# Patient Record
Sex: Male | Born: 1995 | Race: White | Hispanic: No | Marital: Married | Smoking: Never smoker
Health system: Southern US, Community
[De-identification: ages and names within clinical notes are randomized; demographics above are authoritative.]

## PROBLEM LIST (undated history)

## (undated) DIAGNOSIS — F419 Anxiety disorder, unspecified: Secondary | ICD-10-CM

## (undated) DIAGNOSIS — F909 Attention-deficit hyperactivity disorder, unspecified type: Secondary | ICD-10-CM

## (undated) DIAGNOSIS — N289 Disorder of kidney and ureter, unspecified: Secondary | ICD-10-CM

## (undated) DIAGNOSIS — Z87442 Personal history of urinary calculi: Secondary | ICD-10-CM

## (undated) DIAGNOSIS — R0789 Other chest pain: Secondary | ICD-10-CM

## (undated) HISTORY — PX: TONSILLECTOMY: SUR1361

## (undated) HISTORY — PX: WISDOM TOOTH EXTRACTION: SHX21

## (undated) HISTORY — DX: Attention-deficit hyperactivity disorder, unspecified type: F90.9

---

## 2015-01-14 LAB — BASIC METABOLIC PANEL
BUN: 12 (ref 4–21)
Creatinine: 0.8 (ref 0.6–1.3)
Glucose: 87
Sodium: 139 (ref 137–147)

## 2015-01-14 LAB — HEMOGLOBIN A1C: Hemoglobin A1C: 5.2

## 2015-01-14 LAB — HEPATIC FUNCTION PANEL: Bilirubin, Total: 0.9

## 2015-01-14 LAB — TSH: TSH: 1.68 (ref 0.41–5.90)

## 2016-07-23 ENCOUNTER — Encounter (HOSPITAL_COMMUNITY): Payer: Self-pay | Admitting: *Deleted

## 2016-07-23 ENCOUNTER — Emergency Department (HOSPITAL_COMMUNITY): Payer: 59

## 2016-07-23 ENCOUNTER — Emergency Department (HOSPITAL_COMMUNITY)
Admission: EM | Admit: 2016-07-23 | Discharge: 2016-07-23 | Disposition: A | Discharge status: Home / Self Care | Payer: 59 | Attending: Emergency Medicine | Admitting: Emergency Medicine

## 2016-07-23 DIAGNOSIS — R1032 Left lower quadrant pain: Secondary | ICD-10-CM | POA: Diagnosis present

## 2016-07-23 DIAGNOSIS — N200 Calculus of kidney: Secondary | ICD-10-CM | POA: Diagnosis not present

## 2016-07-23 HISTORY — DX: Other chest pain: R07.89

## 2016-07-23 LAB — CBC WITH DIFFERENTIAL/PLATELET
Basophils Absolute: 0 K/uL (ref 0.0–0.1)
Basophils Relative: 0 %
Eosinophils Absolute: 0.1 K/uL (ref 0.0–0.7)
Eosinophils Relative: 1 %
HCT: 43.9 % (ref 39.0–52.0)
Hemoglobin: 14.7 g/dL (ref 13.0–17.0)
Lymphocytes Relative: 23 %
Lymphs Abs: 3 K/uL (ref 0.7–4.0)
MCH: 29.1 pg (ref 26.0–34.0)
MCHC: 33.5 g/dL (ref 30.0–36.0)
MCV: 86.9 fL (ref 78.0–100.0)
Monocytes Absolute: 0.9 K/uL (ref 0.1–1.0)
Monocytes Relative: 7 %
Neutro Abs: 9.1 K/uL — ABNORMAL HIGH (ref 1.7–7.7)
Neutrophils Relative %: 69 %
Platelets: 213 K/uL (ref 150–400)
RBC: 5.05 MIL/uL (ref 4.22–5.81)
RDW: 12.8 % (ref 11.5–15.5)
WBC: 13.1 K/uL — ABNORMAL HIGH (ref 4.0–10.5)

## 2016-07-23 LAB — URINALYSIS, ROUTINE W REFLEX MICROSCOPIC
BILIRUBIN URINE: NEGATIVE
GLUCOSE, UA: NEGATIVE mg/dL
KETONES UR: NEGATIVE mg/dL
Leukocytes, UA: NEGATIVE
NITRITE: NEGATIVE
PH: 6 (ref 5.0–8.0)
Protein, ur: NEGATIVE mg/dL
Specific Gravity, Urine: 1.03 (ref 1.005–1.030)

## 2016-07-23 LAB — COMPREHENSIVE METABOLIC PANEL WITH GFR
ALT: 22 U/L (ref 17–63)
AST: 23 U/L (ref 15–41)
Albumin: 4.7 g/dL (ref 3.5–5.0)
Alkaline Phosphatase: 80 U/L (ref 38–126)
Anion gap: 9 (ref 5–15)
BUN: 13 mg/dL (ref 6–20)
CO2: 25 mmol/L (ref 22–32)
Calcium: 9.3 mg/dL (ref 8.9–10.3)
Chloride: 103 mmol/L (ref 101–111)
Creatinine, Ser: 1.03 mg/dL (ref 0.61–1.24)
GFR calc Af Amer: >60 mL/min
GFR calc non Af Amer: >60 mL/min
Glucose, Bld: 119 mg/dL — ABNORMAL HIGH (ref 65–99)
Potassium: 3.9 mmol/L (ref 3.5–5.1)
Sodium: 137 mmol/L (ref 135–145)
Total Bilirubin: 0.8 mg/dL (ref 0.3–1.2)
Total Protein: 8.1 g/dL (ref 6.5–8.1)

## 2016-07-23 LAB — URINE MICROSCOPIC-ADD ON

## 2016-07-23 LAB — LIPASE, BLOOD: Lipase: 17 U/L (ref 11–51)

## 2016-07-23 MED ORDER — KETOROLAC TROMETHAMINE 30 MG/ML IJ SOLN
30.0000 mg | Freq: Once | INTRAMUSCULAR | Status: AC
  Administered 2016-07-23: 30 mg via INTRAVENOUS
  Filled 2016-07-23: qty 1

## 2016-07-23 MED ORDER — HYDROCODONE-ACETAMINOPHEN 5-325 MG PO TABS: 1.0000 | ORAL_TABLET | Freq: Four times a day (QID) | ORAL | 0 refills | Status: DC | PRN

## 2016-07-23 MED ORDER — ONDANSETRON HCL 4 MG/2ML IJ SOLN
4.0000 mg | Freq: Once | INTRAMUSCULAR | Status: AC
  Administered 2016-07-23: 4 mg via INTRAVENOUS
  Filled 2016-07-23: qty 2

## 2016-07-23 MED ORDER — ONDANSETRON 4 MG PO TBDP: 4.0000 mg | ORAL_TABLET | Freq: Three times a day (TID) | ORAL | 0 refills | Status: DC | PRN

## 2016-07-23 MED ORDER — TAMSULOSIN HCL 0.4 MG PO CAPS: 0.4000 mg | ORAL_CAPSULE | Freq: Every day | ORAL | 0 refills | Status: DC

## 2016-07-23 MED ORDER — SODIUM CHLORIDE 0.9 % IV BOLUS (SEPSIS)
500.0000 mL | Freq: Once | INTRAVENOUS | Status: AC
  Administered 2016-07-23: 500 mL via INTRAVENOUS

## 2016-07-23 MED ORDER — MORPHINE SULFATE (PF) 4 MG/ML IV SOLN
4.0000 mg | Freq: Once | INTRAVENOUS | Status: AC
  Administered 2016-07-23: 4 mg via INTRAVENOUS
  Filled 2016-07-23: qty 1

## 2016-07-23 NOTE — ED Provider Notes (Signed)
[facility]-EMERGENCY DEPT Provider Note   CSN: [id number] Arrival date & time: 07/23/16  4098     History   Chief Complaint Chief Complaint  Patient presents with  . Flank Pain    HPI Jason Wheeler is a 20 y.o. male with no significant past medical history who presents to the ED today complaining of left-sided flank pain. Patient reports intermittent left-sided flank pain onset 3 days ago. Patient states that he was woken up from sleep on Friday night with sharp pain in his left side and subsequently began vomiting. He states the following day he felt much better so he thought this was likely a GI bug. However, he was again woken from sleep this morning with severe left-sided flank pain that is not radiating into his left groin. Patient also has intractable nausea and vomiting, and dysuria onset today. Emesis is nonbloody and nonbilious. He denies hematuria, fevers, chills. Last BM was yesterday and was normal in color and caliber.  HPI  Past Medical History:  Diagnosis Date  . Chest wall pain     There are no active problems to display for this patient.   Past Surgical History:  Procedure Laterality Date  . TONSILLECTOMY         Home Medications    Prior to Admission medications   Not on File    Family History No family history on file.  Social History Social History  Substance Use Topics  . Smoking status: Never Smoker  . Smokeless tobacco: Never Used  . Alcohol use Yes     Comment: 1 beer once a month     Allergies   Review of patient's allergies indicates no known allergies.   Review of Systems Review of Systems  All other systems reviewed and are negative.    Physical Exam Updated Vital Signs BP 117/78 (BP Location: Right Arm)   Pulse (!) 59   Temp 97.5 F (36.4 C) (Oral)   Resp 16   Ht 5' 6.5" (1.689 m)   Wt 113.4 kg   SpO2 100%   BMI 39.75 kg/m   Physical Exam  Constitutional: He is oriented to person, place, and time. He appears  well-developed and well-nourished. No distress.  Patient standing in room, appears uncomfortable  HENT:  Head: Normocephalic and atraumatic.  Mouth/Throat: No oropharyngeal exudate.  Eyes: Conjunctivae and EOM are normal. Pupils are equal, round, and reactive to light. Right eye exhibits no discharge. Left eye exhibits no discharge. No scleral icterus.  Cardiovascular: Normal rate, regular rhythm, normal heart sounds and intact distal pulses.  Exam reveals no gallop and no friction rub.   No murmur heard. Pulmonary/Chest: Effort normal and breath sounds normal. No respiratory distress. He has no wheezes. He has no rales. He exhibits no tenderness.  Abdominal: Soft. Bowel sounds are normal. He exhibits no distension. There is tenderness ( Left lower quadrant). There is no guarding.  Left-sided CVA tenderness  Musculoskeletal: Normal range of motion. He exhibits no edema.  Neurological: He is alert and oriented to person, place, and time.  Skin: Skin is warm and dry. No rash noted. He is not diaphoretic. No erythema. No pallor.  Psychiatric: He has a normal mood and affect. His behavior is normal.  Nursing note and vitals reviewed.    ED Treatments / Results  Labs (all labs ordered are listed, but only abnormal results are displayed) Labs Reviewed  URINALYSIS, ROUTINE W REFLEX MICROSCOPIC (NOT AT [facility]) - Abnormal; Notable for the following:  Result Value   Hgb urine dipstick LARGE (*)    All other components within normal limits  COMPREHENSIVE METABOLIC PANEL - Abnormal; Notable for the following:    Glucose, Bld 119 (*)    All other components within normal limits  CBC WITH DIFFERENTIAL/PLATELET - Abnormal; Notable for the following:    WBC 13.1 (*)    Neutro Abs 9.1 (*)    All other components within normal limits  URINE MICROSCOPIC-ADD ON - Abnormal; Notable for the following:    Squamous Epithelial / LPF 0-5 (*)    Bacteria, UA FEW (*)    Crystals CA OXALATE CRYSTALS (*)      All other components within normal limits  URINE CULTURE  LIPASE, BLOOD    EKG  EKG Interpretation None       Radiology Ct Renal Stone Study  Result Date: 07/23/2016 CLINICAL DATA:  Left-sided flank pain for several days, initial encounter EXAM: CT ABDOMEN AND PELVIS WITHOUT CONTRAST TECHNIQUE: Multidetector CT imaging of the abdomen and pelvis was performed following the standard protocol without IV contrast. COMPARISON:  None. FINDINGS: Lower chest: No acute abnormality. Hepatobiliary: No focal liver abnormality is seen. No gallstones, gallbladder wall thickening, or biliary dilatation. Pancreas: Unremarkable. No pancreatic ductal dilatation or surrounding inflammatory changes. Spleen: Normal in size without focal abnormality. Adrenals/Urinary Tract: Adrenal glands are unremarkable. Kidneys are normal, without renal calculi or focal mass. Mild left-sided hydronephrosis is noted which extends to the level of the left UVJ. A 3-4 mm stone is noted best seen on the coronal images number 94 series 4. The bladder is decompressed. Stomach/Bowel: Stomach is within normal limits. Appendix appears normal. No evidence of bowel wall thickening, distention, or inflammatory changes. Vascular/Lymphatic: No aortic abnormality is noted. Scattered lymph nodes are noted in the mesenteries extending towards a right lower quadrant. This may represent a degree of mesenteric adenitis. Reproductive: Prostate is unremarkable. Other: No abdominal wall hernia or abnormality. No abdominopelvic ascites. Musculoskeletal: No acute or significant osseous findings. IMPRESSION: 3-4 mm stone in the distal left ureter as described with hydronephrosis. Scattered lymph nodes within the mesenteric stenting towards right lower quadrant. No inflammatory changes are seen. This may represent a degree of mesenteric adenitis. Electronically Signed   By: Alcide Clever M.D.   On: 07/23/2016 10:16    Procedures Procedures (including  critical care time)  Medications Ordered in ED Medications  morphine 4 MG/ML injection 4 mg (4 mg Intravenous Given 07/23/16 1011)  ondansetron (ZOFRAN) injection 4 mg (4 mg Intravenous Given 07/23/16 1007)  sodium chloride 0.9 % bolus 500 mL (0 mLs Intravenous Stopped 07/23/16 1056)  ketorolac (TORADOL) 30 MG/ML injection 30 mg (30 mg Intravenous Given 07/23/16 1056)     Initial Impression / Assessment and Plan / ED Course  I have reviewed the triage vital signs and the nursing notes.  Pertinent labs & imaging results that were available during my care of the patient were reviewed by me and considered in my medical decision making (see chart for details).  Clinical Course    Otherwise healthy 20 well-developed male presents to the ED today complaining of sudden onset left-sided flank pain radiating to left groin associated vomiting and dysuria. Patient appears uncomfortable in the ED but his otherwise nontoxic and nonseptic appearing. Concern for nephrolithiasis. Mild leukocytosis present, W BC 13.1. UA does not appear to be infected. CT reveals 3-4 mm stone in the distal left ureter with mild hydronephrosis. Patient was given a dose of IV morphine  and Zofran with moderate symptomatic improvement. Patient was then given additional dose of Toradol with significant symptomatic improvement. Patient is now resting comfortably. No further episodes of emesis while in the ED. Will DC with Flomax, pain and nausea medication. Follow-up with urology for reevaluation. Return precautions outlined in patient discharge instructions.  Final Clinical Impressions(s) / ED Diagnoses   Final diagnoses:  Kidney stone    New Prescriptions New Prescriptions   No medications on file     Dub MikesSamantha Tripp Dowless, PA-C 07/23/16 1137    Cathren LaineKevin Steinl, MD 07/24/16 1502

## 2016-07-23 NOTE — ED Triage Notes (Signed)
Patient states on Friday night and Saturday morning he had left flank pain.  The pain was so intense that he began breathing faster and then had chest pain which has since resolved.  Patient states flank pain improved Saturday night and Sunday, but returned again this morning and is worse than ever.  Patient denies hematuria, but endorses dysuria this morning.  Patient had one episode of vomiting on Saturday.  Patient denies fever and diarrhea.

## 2016-07-23 NOTE — Discharge Instructions (Signed)
Take pain and nausea medications as needed. Take Flomax daily. Drink plenty of fluids. Follow-up with urology for reevaluation. Use filter when urinating to attempt to collect stone. Return to the ED if you experience severe worsening of your symptoms, fevers, uncontrollable vomiting, increased pain.

## 2016-07-23 NOTE — ED Notes (Signed)
Patient presents to the ED from home where he lives with a roommate with complaints of flank pain since Saturday past.  Patient rates pain 9/10.  Patient endorses one episode of vomiting on Saturday, but denies diarrhea. Patient denies fever.    On exam, patient's lung sounds are CTAB.  Heart sounds, S1/S2. Patient's abdomen is soft and tender to palpation, worse LLQ.  Bowel sounds present, but hypoactive.  Patient is alert and oriented to person, place, time and events.

## 2016-07-23 NOTE — ED Notes (Signed)
Pt vomited all over bed. PA at bedside

## 2016-07-24 LAB — URINE CULTURE: Culture: NO GROWTH

## 2017-03-29 DIAGNOSIS — N201 Calculus of ureter: Secondary | ICD-10-CM | POA: Insufficient documentation

## 2017-08-18 IMAGING — CT CT RENAL STONE PROTOCOL
2 of 3 series · 16 of 46 positions shown, 18 images · non-contrast
Comparison: None.

CLINICAL DATA: Left-sided flank pain for several days, initial
encounter

EXAM:
CT ABDOMEN AND PELVIS WITHOUT CONTRAST
TECHNIQUE: Multidetector CT imaging of the abdomen and pelvis was performed
following the standard protocol without IV contrast.

[Series 3: lung · axial · 0.96mm/px · z∈[-100,-18]mm · 13 of 49 slices shown, 15 images]
[im 4/49  soft-tissue]
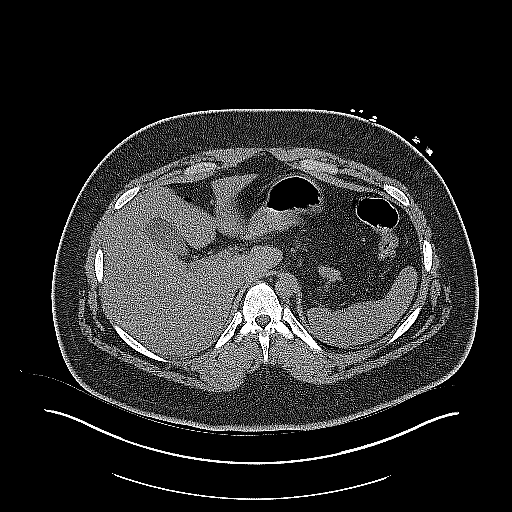
[im 4/49  bone]
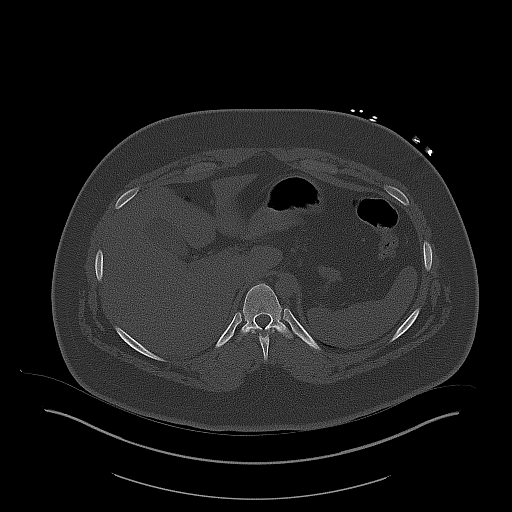
[im 7/49  soft-tissue]
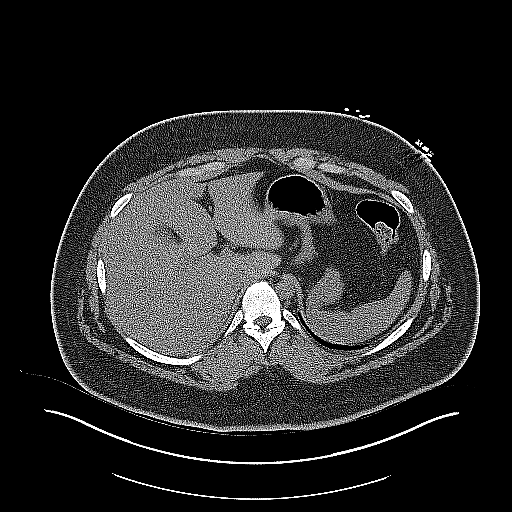
[im 10/49  soft-tissue]
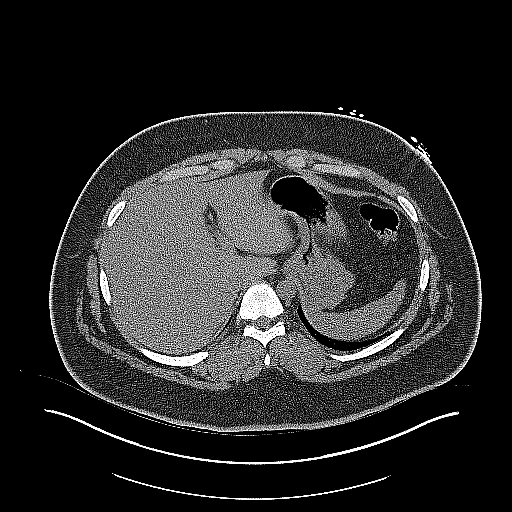
[im 14/49  soft-tissue]
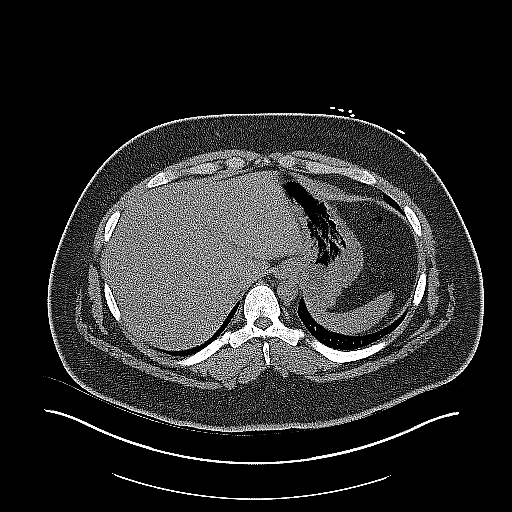
[im 18/49  soft-tissue]
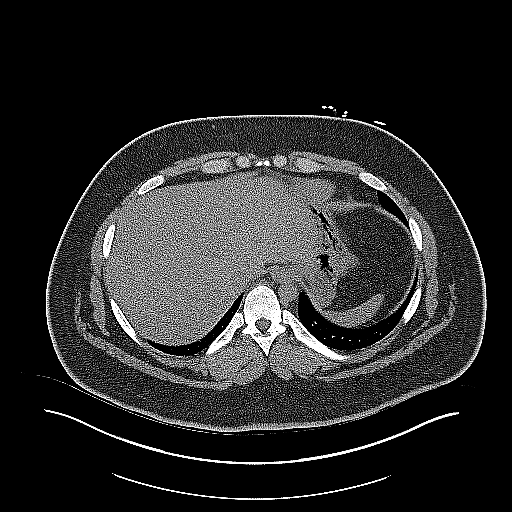
[im 21/49  soft-tissue]
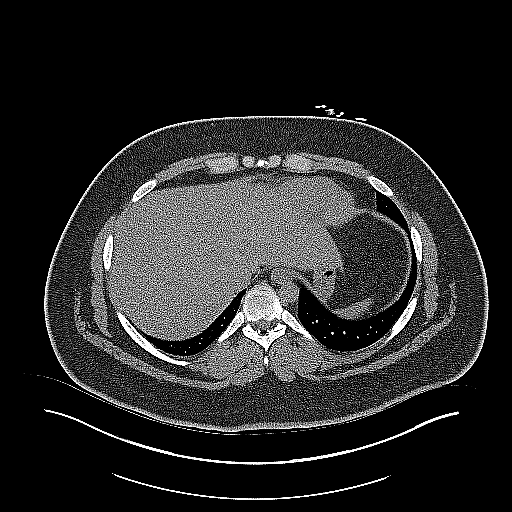
[im 25/49  soft-tissue]
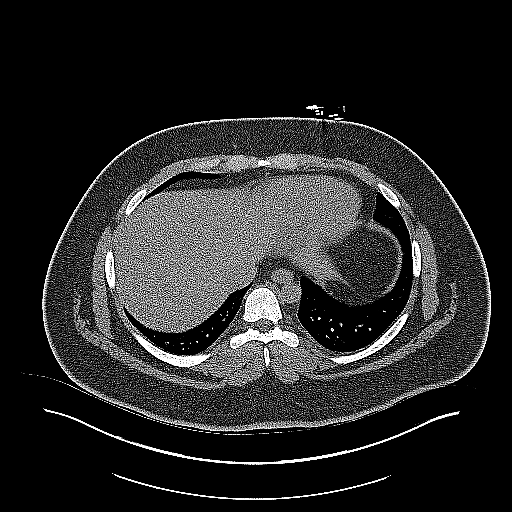
[im 28/49  soft-tissue]
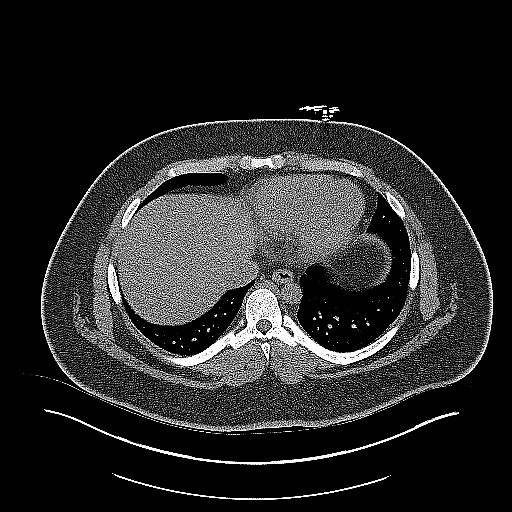
[im 31/49  soft-tissue]
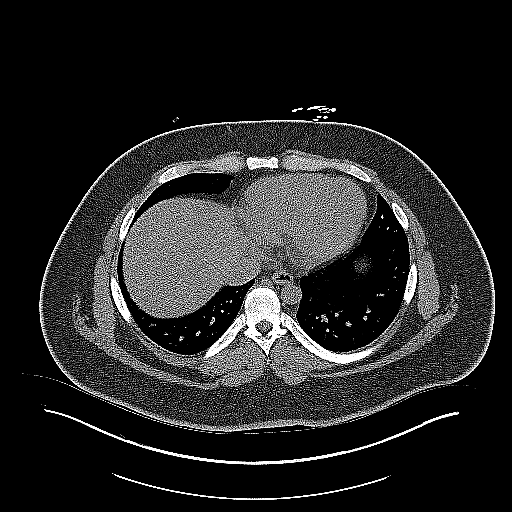
[im 31/49  bone]
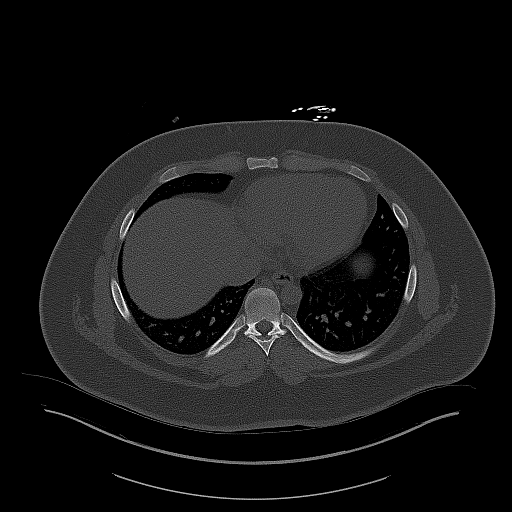
[im 35/49  soft-tissue]
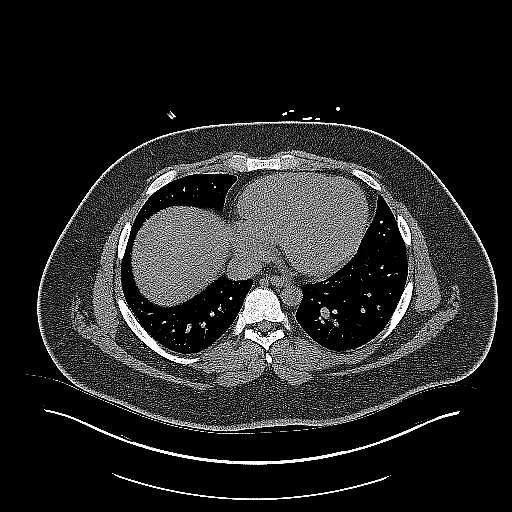
[im 39/49  soft-tissue]
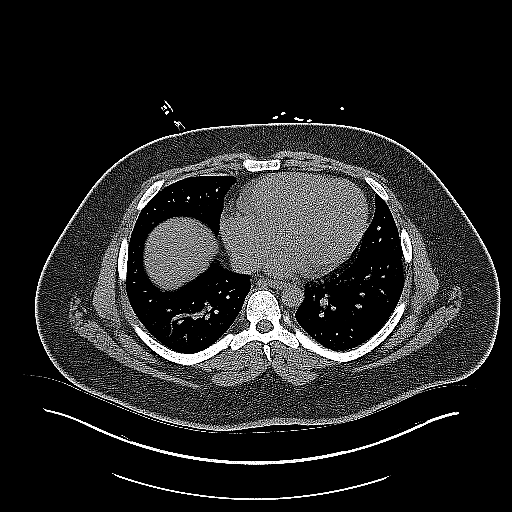
[im 42/49  soft-tissue]
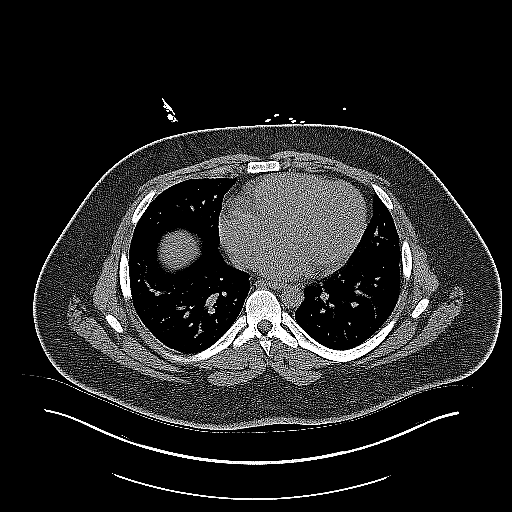
[im 45/49  soft-tissue]
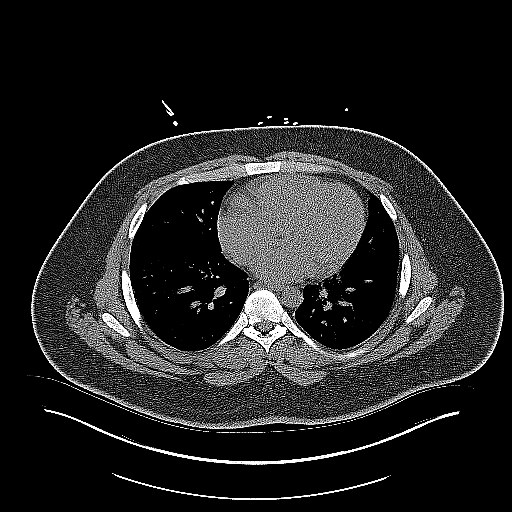

[Series 4: coronal · coronal · 0.83mm/px · 3 of 153 slices shown]
[im 51/153  soft-tissue]
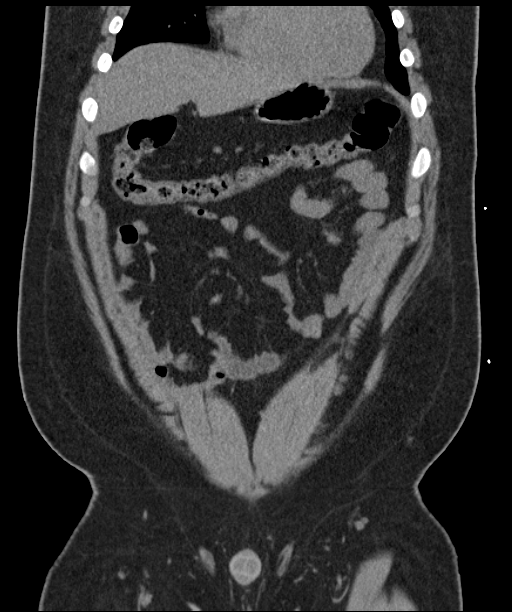
[im 68/153  soft-tissue]
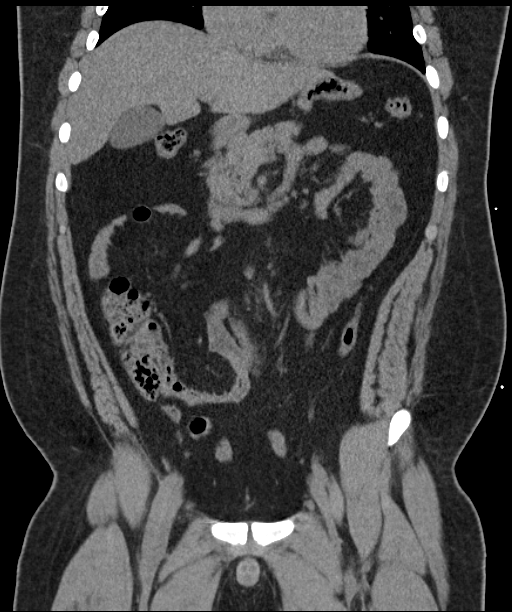
[im 85/153  soft-tissue]
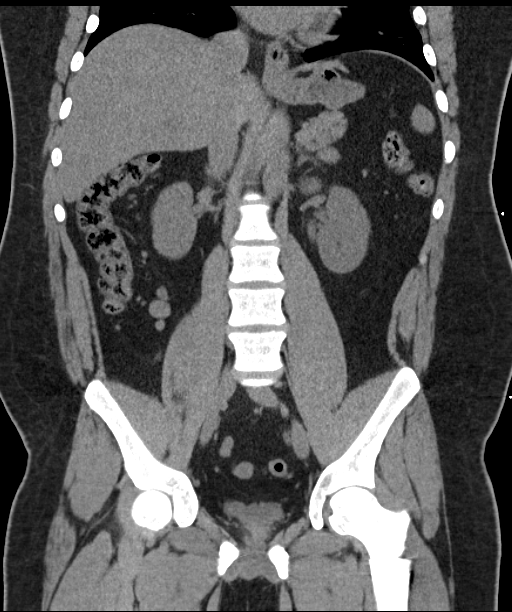

[16 of 46 positions shown; findings below may reference images not displayed]

FINDINGS: Lower chest: No acute abnormality.

Hepatobiliary: No focal liver abnormality is seen. No gallstones,
gallbladder wall thickening, or biliary dilatation.

Pancreas: Unremarkable. No pancreatic ductal dilatation or
surrounding inflammatory changes.

Spleen: Normal in size without focal abnormality.

Adrenals/Urinary Tract: Adrenal glands are unremarkable. Kidneys are
normal, without renal calculi or focal mass. Mild left-sided
hydronephrosis is noted which extends to the level of the left UVJ.
A 3-4 mm stone is noted best seen on the coronal images number 94
series 4. The bladder is decompressed.

Stomach/Bowel: Stomach is within normal limits. Appendix appears
normal. No evidence of bowel wall thickening, distention, or
inflammatory changes.

Vascular/Lymphatic: No aortic abnormality is noted. Scattered lymph
nodes are noted in the mesenteries extending towards a right lower
quadrant. This may represent a degree of mesenteric adenitis.

Reproductive: Prostate is unremarkable.

Other: No abdominal wall hernia or abnormality. No abdominopelvic
ascites.

Musculoskeletal: No acute or significant osseous findings.
IMPRESSION: 3-4 mm stone in the distal left ureter as described with
hydronephrosis.

Scattered lymph nodes within the mesenteric stenting towards right
lower quadrant. No inflammatory changes are seen. This may represent
a degree of mesenteric adenitis.

## 2018-01-21 DIAGNOSIS — N200 Calculus of kidney: Secondary | ICD-10-CM | POA: Diagnosis not present

## 2018-02-20 DIAGNOSIS — F4010 Social phobia, unspecified: Secondary | ICD-10-CM | POA: Diagnosis not present

## 2018-02-20 DIAGNOSIS — F605 Obsessive-compulsive personality disorder: Secondary | ICD-10-CM | POA: Diagnosis not present

## 2018-02-20 DIAGNOSIS — F900 Attention-deficit hyperactivity disorder, predominantly inattentive type: Secondary | ICD-10-CM | POA: Diagnosis not present

## 2018-02-27 DIAGNOSIS — H5211 Myopia, right eye: Secondary | ICD-10-CM | POA: Diagnosis not present

## 2018-02-27 DIAGNOSIS — H52222 Regular astigmatism, left eye: Secondary | ICD-10-CM | POA: Diagnosis not present

## 2018-03-18 DIAGNOSIS — F900 Attention-deficit hyperactivity disorder, predominantly inattentive type: Secondary | ICD-10-CM | POA: Diagnosis not present

## 2018-03-18 DIAGNOSIS — F605 Obsessive-compulsive personality disorder: Secondary | ICD-10-CM | POA: Diagnosis not present

## 2018-03-18 DIAGNOSIS — F4010 Social phobia, unspecified: Secondary | ICD-10-CM | POA: Diagnosis not present

## 2018-04-28 ENCOUNTER — Emergency Department (HOSPITAL_COMMUNITY): Payer: 59

## 2018-04-28 ENCOUNTER — Encounter (HOSPITAL_COMMUNITY): Payer: Self-pay | Admitting: Emergency Medicine

## 2018-04-28 ENCOUNTER — Emergency Department (HOSPITAL_COMMUNITY)
Admission: EM | Admit: 2018-04-28 | Discharge: 2018-04-29 | Disposition: A | Discharge status: Home / Self Care | Payer: 59 | Attending: Emergency Medicine | Admitting: Emergency Medicine

## 2018-04-28 DIAGNOSIS — R55 Syncope and collapse: Secondary | ICD-10-CM | POA: Insufficient documentation

## 2018-04-28 DIAGNOSIS — Z79899 Other long term (current) drug therapy: Secondary | ICD-10-CM | POA: Insufficient documentation

## 2018-04-28 DIAGNOSIS — R200 Anesthesia of skin: Secondary | ICD-10-CM | POA: Diagnosis not present

## 2018-04-28 DIAGNOSIS — S39012A Strain of muscle, fascia and tendon of lower back, initial encounter: Secondary | ICD-10-CM

## 2018-04-28 DIAGNOSIS — G44209 Tension-type headache, unspecified, not intractable: Secondary | ICD-10-CM | POA: Diagnosis not present

## 2018-04-28 DIAGNOSIS — R51 Headache: Secondary | ICD-10-CM | POA: Diagnosis not present

## 2018-04-28 DIAGNOSIS — S29012A Strain of muscle and tendon of back wall of thorax, initial encounter: Secondary | ICD-10-CM | POA: Diagnosis not present

## 2018-04-28 HISTORY — DX: Disorder of kidney and ureter, unspecified: N28.9

## 2018-04-28 LAB — CBC WITH DIFFERENTIAL/PLATELET
Abs Immature Granulocytes: 0 10*3/uL (ref 0.0–0.1)
Basophils Absolute: 0 10*3/uL (ref 0.0–0.1)
Basophils Relative: 0 %
Eosinophils Absolute: 0.1 10*3/uL (ref 0.0–0.7)
Eosinophils Relative: 1 %
HCT: 43.9 % (ref 39.0–52.0)
Hemoglobin: 14.3 g/dL (ref 13.0–17.0)
Immature Granulocytes: 0 %
Lymphocytes Relative: 11 %
Lymphs Abs: 1.4 10*3/uL (ref 0.7–4.0)
MCH: 28.5 pg (ref 26.0–34.0)
MCHC: 32.6 g/dL (ref 30.0–36.0)
MCV: 87.6 fL (ref 78.0–100.0)
Monocytes Absolute: 0.8 10*3/uL (ref 0.1–1.0)
Monocytes Relative: 7 %
Neutro Abs: 10 10*3/uL — ABNORMAL HIGH (ref 1.7–7.7)
Neutrophils Relative %: 81 %
Platelets: 192 10*3/uL (ref 150–400)
RBC: 5.01 MIL/uL (ref 4.22–5.81)
RDW: 12.5 % (ref 11.5–15.5)
WBC: 12.3 10*3/uL — ABNORMAL HIGH (ref 4.0–10.5)

## 2018-04-28 LAB — BASIC METABOLIC PANEL
Anion gap: 8 (ref 5–15)
BUN: 11 mg/dL (ref 6–20)
CO2: 25 mmol/L (ref 22–32)
Calcium: 9.3 mg/dL (ref 8.9–10.3)
Chloride: 105 mmol/L (ref 98–111)
Creatinine, Ser: 0.95 mg/dL (ref 0.61–1.24)
GFR calc Af Amer: >60 mL/min (ref >60)
GFR calc non Af Amer: >60 mL/min (ref >60)
Glucose, Bld: 108 mg/dL — ABNORMAL HIGH (ref 70–99)
Potassium: 3.9 mmol/L (ref 3.5–5.1)
Sodium: 138 mmol/L (ref 135–145)

## 2018-04-28 NOTE — ED Triage Notes (Signed)
Pt presents with lumbar spine pain, headache and numbness; pt states that his back pain started first and while sleeping/lying in bed experienced a headache that woke him up, when he sat up in bed he experienced an all over numbness which has resolved

## 2018-04-28 NOTE — ED Provider Notes (Signed)
Patient placed in Quick Look pathway, seen and evaluated   Chief Complaint: numbness, headache, back pain/numbness  HPI:   Patient presents with sudden onset numbness of the mid thoracic and lumbar region which began at around 1 PM.  Patient works overnights at the neuro ICU, states he came home from work and fell asleep and woke up around 10:30 AM.  Around 1 PM developed numbness of his mid and lower back which does not radiate.  He denies bowel or bladder incontinence, saddle anesthesia.  States he felt uncoordinated while he was packing a bag.  States that he then developed a gradual onset, per aggressively worsening frontal headache which is throbbing in nature.  Denies vision changes, slurred speech, or syncope.  Does note mild low back pain at this time, denies any known trauma or falls or bending/lifting/twisting injuries.  ROS: Positive for numbness, headache, back pain Negative for syncope, vision changes  Physical Exam:   Gen: No distress  Psych: Appropriate mood and affect  Skin: Warm    Focused Exam: Cranial nerves II through XII tested intact.  Fluent speech with no evidence of dysarthria or aphasia, no facial droop.  Sensation intact to soft touch of extremities.  There is diffuse midline thoracic and lumbar spine tenderness at around the levels of T10-S1 with bilateral parathoracic and paralumbar muscle tenderness.  No deformity, crepitus, or step-off noted.  5/5 strength of BUE and BLE major muscle groups.  Normal range of motion of the lumbar spine with no significant pain elicited.  No nuchal rigidity, normal active and passive range of motion of the cervical spine.   Initiation of care has begun. The patient has been counseled on the process, plan, and necessity for staying for the completion/evaluation, and the remainder of the medical screening examination    Bennye AlmFawze, Raha Tennison A, PA-C 04/28/18 1817    Nira Connardama, Pedro Eduardo, MD 04/29/18 [id number]

## 2018-04-29 DIAGNOSIS — R51 Headache: Secondary | ICD-10-CM | POA: Diagnosis not present

## 2018-04-29 DIAGNOSIS — R55 Syncope and collapse: Secondary | ICD-10-CM | POA: Diagnosis not present

## 2018-04-29 DIAGNOSIS — Z79899 Other long term (current) drug therapy: Secondary | ICD-10-CM | POA: Diagnosis not present

## 2018-04-29 MED ORDER — KETOROLAC TROMETHAMINE 60 MG/2ML IM SOLN
30.0000 mg | Freq: Once | INTRAMUSCULAR | Status: AC
  Administered 2018-04-29: 30 mg via INTRAMUSCULAR
  Filled 2018-04-29: qty 2

## 2018-04-29 NOTE — ED Provider Notes (Signed)
[facility] EMERGENCY DEPARTMENT Provider Note  CSN: [id number] Arrival date & time: 04/28/18 1705  Chief Complaint(s) Back Pain; Headache; and Numbness  HPI Jason Wheeler is a 22 y.o. male with no significant past medical history presents to the emergency department for episode of generalized "numbness" of the entire body that occurred at 1 PM today.  Patient reports that he was sitting in bed watching TV and had just finished eating when the symptoms began.  He reports that he felt mid thoracic and lumbar back pain during this episode.  States that  after this episode he woke up and was lying flat on the bed.  He is unsure whether he had a syncopal episode or not.  States that his back pain is exacerbated with twisting and bending.  No alleviating factors.  Has not taken any medication for this.  He reports that several hours after the incident he developed a gradual onset and progressively worsening frontal headache that is throbbing in nature.  Headache has since improved over the last several hours.  Denies any visual changes, difficulty speaking, focal deficits.  He denied any lower extremity weakness or loss of sensation.  No bladder or bowel incontinence.  No saddle anesthesia.  He denies any recent fevers or infections.  No nausea or vomiting.  No urinary symptoms.  No chest pain or shortness of breath.  Patient denied any hematochezia or melena.  Denied any recent trauma.  The history is provided by the patient.    Past Medical History Past Medical History:  Diagnosis Date  . Chest wall pain   . Renal disorder    kidney stones   There are no active problems to display for this patient.  Home Medication(s) Prior to Admission medications   Medication Sig Start Date End Date Taking? Authorizing Provider  HYDROcodone-acetaminophen (NORCO) 5-325 MG tablet Take 1 tablet by mouth every 6 (six) hours as needed for moderate pain. 07/23/16   Dowless, Samantha Tripp, PA-C   ondansetron (ZOFRAN ODT) 4 MG disintegrating tablet Take 1 tablet (4 mg total) by mouth every 8 (eight) hours as needed for nausea or vomiting. 07/23/16   Dowless, Lelon Mast Tripp, PA-C  tamsulosin (FLOMAX) 0.4 MG CAPS capsule Take 1 capsule (0.4 mg total) by mouth daily. 07/23/16   Dowless, Lester Kinsman, PA-C                                                                                                                                    Past Surgical History Past Surgical History:  Procedure Laterality Date  . TONSILLECTOMY     Family History No family history on file.  Social History Social History   Tobacco Use  . Smoking status: Never Smoker  . Smokeless tobacco: Never Used  Substance Use Topics  . Alcohol use: Yes    Comment: 1 beer once a month  . Drug use: Not on file  Allergies Patient has no known allergies.  Review of Systems Review of Systems All other systems are reviewed and are negative for acute change except as noted in the HPI  Physical Exam Vital Signs  I have reviewed the triage vital signs BP 111/80 (BP Location: Left Arm)   Pulse 71   Temp 97.9 F (36.6 C) (Oral)   Resp 16   Ht 5\' 7"  (1.702 m)   Wt 113.4 kg (250 lb)   SpO2 98%   BMI 39.16 kg/m   Physical Exam  Constitutional: He is oriented to person, place, and time. He appears well-developed and well-nourished. No distress.  HENT:  Head: Normocephalic and atraumatic.  Nose: Nose normal.  Eyes: Pupils are equal, round, and reactive to light. Conjunctivae and EOM are normal. Right eye exhibits no discharge. Left eye exhibits no discharge. No scleral icterus.  Neck: Normal range of motion. Neck supple.  Cardiovascular: Normal rate and regular rhythm. Exam reveals no gallop and no friction rub.  No murmur heard. Pulmonary/Chest: Effort normal and breath sounds normal. No stridor. No respiratory distress. He has no rales.  Abdominal: Soft. He exhibits no distension. There is no tenderness.    Musculoskeletal: He exhibits no edema.       Thoracic back: He exhibits tenderness and spasm. He exhibits no bony tenderness.       Back:  Neurological: He is alert and oriented to person, place, and time.  Mental Status:  Alert and oriented to person, place, and time.  Attention and concentration normal.  Speech clear.  Recent memory is intact  Cranial Nerves:  II Visual Fields: Intact to confrontation. Visual fields intact. III, IV, VI: Pupils equal and reactive to light and near. Full eye movement without nystagmus  V Facial Sensation: Normal. No weakness of masticatory muscles  VII: No facial weakness or asymmetry  VIII Auditory Acuity: Grossly normal  IX/X: The uvula is midline; the palate elevates symmetrically  XI: Normal sternocleidomastoid and trapezius strength  XII: The tongue is midline. No atrophy or fasciculations.   Motor System: Muscle Strength: 5/5 and symmetric in the upper and lower extremities. No pronation or drift.  Muscle Tone: Tone and muscle bulk are normal in the upper and lower extremities.   Reflexes: DTRs: 1+ and symmetrical in all four extremities. No Clonus Coordination: Intact finger-to-nose, heel-to-shin. No tremor.  Sensation: Intact to light touch, and pinprick. Negative Romberg test.  Gait: Routine gait normal.   Skin: Skin is warm and dry. No rash noted. He is not diaphoretic. No erythema.  Psychiatric: He has a normal mood and affect.  Vitals reviewed.   ED Results and Treatments Labs (all labs ordered are listed, but only abnormal results are displayed) Labs Reviewed  BASIC METABOLIC PANEL - Abnormal; Notable for the following components:      Result Value   Glucose, Bld 108 (*)    All other components within normal limits  CBC WITH DIFFERENTIAL/PLATELET - Abnormal; Notable for the following components:   WBC 12.3 (*)    Neutro Abs 10.0 (*)    All other components within normal limits  EKG  EKG Interpretation  Date/Time:  Tuesday April 29 2018 00:17:31 EDT Ventricular Rate:  70 PR Interval:  166 QRS Duration: 84 QT Interval:  352 QTC Calculation: 380 R Axis:   15 Text Interpretation:  Sinus rhythm with marked sinus arrhythmia Otherwise normal ECG not Confirmed by Drema Pryardama, Mohid Furuya [phone]([id number]) on 04/29/2018 12:23:39 AM      Radiology Ct Head Wo Contrast  Result Date: 04/28/2018 CLINICAL DATA:  Altered level of consciousness, frontal headache, low back pain EXAM: CT HEAD WITHOUT CONTRAST TECHNIQUE: Contiguous axial images were obtained from the base of the skull through the vertex without intravenous contrast. Sagittal and coronal MPR images reconstructed from axial data set. COMPARISON:  None FINDINGS: Brain: Normal ventricular morphology. No midline shift or mass effect. Normal appearance of brain parenchyma. No intracranial hemorrhage, mass lesion or evidence of acute infarction. No extra-axial fluid collections. Vascular: No hyperdense vessels Skull: Intact Sinuses/Orbits: Clear Other: N/A IMPRESSION: Normal exam. Electronically Signed   By: Ulyses SouthwardMark  Boles M.D.   On: 04/28/2018 21:09   Pertinent labs & imaging results that were available during my care of the patient were reviewed by me and considered in my medical decision making (see chart for details).  Medications Ordered in ED Medications  ketorolac (TORADOL) injection 30 mg (30 mg Intramuscular Given 04/29/18 0019)                                                                                                                                    Procedures Procedures  (including critical care time)  Medical Decision Making / ED Course I have reviewed the nursing notes for this encounter and the patient's prior records (if available in EHR or on provided paperwork).     1. Possible Syncope EKG without acute ischemic changes or evidence of pericarditis.   Revealed normal sinus rhythm with a rate of 70 with sinus dysrhythmia.  No evidence of Brugada, epsilon waves, prolonged QT, or HOCM. Patient seen in first look process and screening labs obtain revealed stable leukocytosis when compared to prior.  No anemia.  No significant electrolyte derangements or renal insufficiency.  2. Headache Non focal neuro exam. No recent head trauma. No fever. Doubt meningitis. Doubt intracranial bleed. Doubt IIH.    Patient already received a CT scan ordered in the first look process which was unremarkable.  3. Back pain Back pain in thoracic and lumbar area for 10 hrs without signs of radicular pain. No acute traumatic onset. No red flag symptoms of fever, weight loss, saddle anesthesia, weakness, fecal/urinary incontinence or urinary retention.   Suspect MSK etiology. No indication for imaging emergently. Patient was recommended to take short course of scheduled NSAIDs and engage in early mobility as definitive treatment. Return precautions discussed for worsening or new concerning symptoms.   The patient appears reasonably screened and/or stabilized for discharge and I doubt any other medical condition or other [facility] requiring further screening,  evaluation, or treatment in the ED at this time prior to discharge.  The patient is safe for discharge with strict return precautions.  Final Clinical Impression(s) / ED Diagnoses Final diagnoses:  Syncope, unspecified syncope type  Back strain, initial encounter  Acute non intractable tension-type headache    Disposition: Discharge  Condition: Good  I have discussed the results, Dx and Tx plan with the patient who expressed understanding and agree(s) with the plan. Discharge instructions discussed at great length. The patient was given strict return precautions who verbalized understanding of the instructions. No further questions at time of discharge.    ED Discharge Orders    None       Follow  Up: Primary care provider  Schedule an appointment as soon as possible for a visit  As needed     This chart was dictated using voice recognition software.  Despite best efforts to proofread,  errors can occur which can change the documentation meaning.   Nira Conn, MD 04/29/18 [place] Manners

## 2018-04-29 NOTE — Discharge Instructions (Addendum)
You may use over-the-counter Motrin (Ibuprofen), Acetaminophen (Tylenol), topical muscle creams such as SalonPas, Icy Hot, Bengay, etc. Please stretch, apply heat, and have massage therapy for additional assistance. ° °

## 2018-12-22 DIAGNOSIS — F4010 Social phobia, unspecified: Secondary | ICD-10-CM | POA: Diagnosis not present

## 2018-12-22 DIAGNOSIS — F605 Obsessive-compulsive personality disorder: Secondary | ICD-10-CM | POA: Diagnosis not present

## 2018-12-22 DIAGNOSIS — F900 Attention-deficit hyperactivity disorder, predominantly inattentive type: Secondary | ICD-10-CM | POA: Diagnosis not present

## 2019-01-14 ENCOUNTER — Other Ambulatory Visit: Payer: Self-pay

## 2019-01-14 ENCOUNTER — Ambulatory Visit (INDEPENDENT_AMBULATORY_CARE_PROVIDER_SITE_OTHER): Payer: 59 | Admitting: Family Medicine

## 2019-01-14 ENCOUNTER — Encounter: Payer: Self-pay | Admitting: Family Medicine

## 2019-01-14 VITALS — BP 124/78 | HR 74 | Temp 97.9°F | Ht 67.0 in | Wt 253.8 lb

## 2019-01-14 DIAGNOSIS — Z13220 Encounter for screening for lipoid disorders: Secondary | ICD-10-CM | POA: Diagnosis not present

## 2019-01-14 DIAGNOSIS — F909 Attention-deficit hyperactivity disorder, unspecified type: Secondary | ICD-10-CM | POA: Insufficient documentation

## 2019-01-14 DIAGNOSIS — Z6839 Body mass index (BMI) 39.0-39.9, adult: Secondary | ICD-10-CM | POA: Diagnosis not present

## 2019-01-14 DIAGNOSIS — Z87442 Personal history of urinary calculi: Secondary | ICD-10-CM

## 2019-01-14 DIAGNOSIS — Z131 Encounter for screening for diabetes mellitus: Secondary | ICD-10-CM | POA: Diagnosis not present

## 2019-01-14 DIAGNOSIS — Z0001 Encounter for general adult medical examination with abnormal findings: Secondary | ICD-10-CM | POA: Diagnosis not present

## 2019-01-14 LAB — COMPREHENSIVE METABOLIC PANEL
ALBUMIN: 4.6 g/dL (ref 3.5–5.2)
ALK PHOS: 73 U/L (ref 39–117)
ALT: 21 U/L (ref 0–53)
AST: 17 U/L (ref 0–37)
BUN: 14 mg/dL (ref 6–23)
CHLORIDE: 104 meq/L (ref 96–112)
CO2: 27 mEq/L (ref 19–32)
Calcium: 9.8 mg/dL (ref 8.4–10.5)
Creatinine, Ser: 0.84 mg/dL (ref 0.40–1.50)
GFR: 113.67 mL/min (ref >60.00)
GLUCOSE: 89 mg/dL (ref 70–99)
POTASSIUM: 4.3 meq/L (ref 3.5–5.1)
SODIUM: 140 meq/L (ref 135–145)
TOTAL PROTEIN: 7.3 g/dL (ref 6.0–8.3)
Total Bilirubin: 0.6 mg/dL (ref 0.2–1.2)

## 2019-01-14 LAB — LIPID PANEL
Cholesterol: 221 mg/dL — ABNORMAL HIGH (ref 0–200)
HDL: 30.5 mg/dL — AB (ref >39.00)
LDL CALC: 167 mg/dL — AB (ref 0–99)
NonHDL: 190
Total CHOL/HDL Ratio: 7
Triglycerides: 116 mg/dL (ref 0.0–149.0)
VLDL: 23.2 mg/dL (ref 0.0–40.0)

## 2019-01-14 LAB — CBC
HEMATOCRIT: 43.4 % (ref 39.0–52.0)
HEMOGLOBIN: 15 g/dL (ref 13.0–17.0)
MCHC: 34.5 g/dL (ref 30.0–36.0)
MCV: 85.1 fl (ref 78.0–100.0)
Platelets: 197 10*3/uL (ref 150.0–400.0)
RBC: 5.1 Mil/uL (ref 4.22–5.81)
RDW: 13 % (ref 11.5–15.5)
WBC: 9.9 10*3/uL (ref 4.0–10.5)

## 2019-01-14 NOTE — Progress Notes (Signed)
Chief Complaint:  Jason Wheeler is a 23 y.o. male who presents today for his annual comprehensive physical exam.    Assessment/Plan:  ADHD Continue management per psychiatry.   History of Nephrolithiasis Check CBC and CMET.   BMI 39 Discussed lifestyle modifications.  Preventative Healthcare: Check lipid panel. Screen for DM on CMET.   Patient Counseling(The following topics were reviewed and/or handout was given):  -Nutrition: Stressed importance of moderation in sodium/caffeine intake, saturated fat and cholesterol, caloric balance, sufficient intake of fresh fruits, vegetables, and fiber.  -Stressed the importance of regular exercise.   -Substance Abuse: Discussed cessation/primary prevention of tobacco, alcohol, or other drug use; driving or other dangerous activities under the influence; availability of treatment for abuse.   -Injury prevention: Discussed safety belts, safety helmets, smoke detector, smoking near bedding or upholstery.   -Sexuality: Discussed sexually transmitted diseases, partner selection, use of condoms, avoidance of unintended pregnancy and contraceptive alternatives.   -Dental health: Discussed importance of regular tooth brushing, flossing, and dental visits.  -Health maintenance and immunizations reviewed. Please refer to Health maintenance section.  Return to care in 1 year for next preventative visit.     Subjective:  HPI:  He has no acute complaints today.   His  chronic medical conditions are outlined below:  # ADHD - Follows with psychiatry - Currently on concerta 36mg  daily  Lifestyle Diet: No specific diets or eating plans.  Exercise: Does not exercise regularly.  Depression screen PHQ 2/9 01/14/2019  Decreased Interest 0  Down, Depressed, Hopeless 0  PHQ - 2 Score 0   Health Maintenance Due  Topic Date Due  . HIV Screening  06/21/2011    ROS: Per HPI, otherwise a complete review of systems was negative.   PMH:  The  following were reviewed and entered/updated in epic: Past Medical History:  Diagnosis Date  . Chest wall pain   . Renal disorder    kidney stones   Patient Active Problem List   Diagnosis Date Noted  . ADHD 01/14/2019   Past Surgical History:  Procedure Laterality Date  . TONSILLECTOMY      Family History  Problem Relation Age of Onset  . Diabetes Mother   . Hypertension Mother   . Miscarriages / [place] Mother   . Hypertension Father   . Throat cancer Maternal Grandfather   . Heart disease Paternal Grandmother   . Stroke Paternal Grandmother   . Prostate cancer Neg Hx   . Colon cancer Neg Hx     Medications- reviewed and updated Current Outpatient Medications  Medication Sig Dispense Refill  . amphetamine-dextroamphetamine (ADDERALL XR) 10 MG 24 hr capsule Take 10 mg by mouth daily.    . methylphenidate (RITALIN) 5 MG tablet Take 5-10 mg by mouth 2 (two) times daily.    . methylphenidate 36 MG PO CR tablet Take 36 mg by mouth daily.     No current facility-administered medications for this visit.     Allergies-reviewed and updated No Known Allergies  Social History   Socioeconomic History  . Marital status: Single    Spouse name: Not on file  . Number of children: Not on file  . Years of education: Not on file  . Highest education level: Not on file  Occupational History  . Not on file  Social Needs  . Financial resource strain: Not on file  . Food insecurity:    Worry: Not on file    Inability: Not on file  . Transportation  needs:    Medical: Not on file    Non-medical: Not on file  Tobacco Use  . Smoking status: Never Smoker  . Smokeless tobacco: Never Used  Substance and Sexual Activity  . Alcohol use: Yes    Comment: 1 beer once a month  . Drug use: Never  . Sexual activity: Not on file  Lifestyle  . Physical activity:    Days per week: Not on file    Minutes per session: Not on file  . Stress: Not on file  Relationships  . Social  connections:    Talks on phone: Not on file    Gets together: Not on file    Attends religious service: Not on file    Active member of club or organization: Not on file    Attends meetings of clubs or organizations: Not on file    Relationship status: Not on file  Other Topics Concern  . Not on file  Social History Narrative  . Not on file        Objective:  Physical Exam: BP 124/78 (BP Location: Left Arm, Patient Position: Sitting, Cuff Size: Normal)   Pulse 74   Temp 97.9 F (36.6 C) (Oral)   Ht 5\' 7"  (1.702 m)   Wt 253 lb 12.8 oz (115.1 kg)   SpO2 96%   BMI 39.75 kg/m   Body mass index is 39.75 kg/m. Wt Readings from Last 3 Encounters:  01/14/19 253 lb 12.8 oz (115.1 kg)  04/28/18 250 lb (113.4 kg)  07/23/16 250 lb (113.4 kg)   Gen: NAD, resting comfortably HEENT: TMs normal bilaterally. OP clear. No thyromegaly noted.  CV: RRR with no murmurs appreciated Pulm: NWOB, CTAB with no crackles, wheezes, or rhonchi GI: Normal bowel sounds present. Soft, Nontender, Nondistended. MSK: no edema, cyanosis, or clubbing noted Skin: warm, dry Neuro: CN2-12 grossly intact. Strength 5/5 in upper and lower extremities. Reflexes symmetric and intact bilaterally.  Psych: Normal affect and thought content     Caleb M. Jimmey Ralph, MD 01/14/2019 9:10 AM

## 2019-01-14 NOTE — Assessment & Plan Note (Signed)
Continue management per psychiatry. 

## 2019-01-14 NOTE — Patient Instructions (Signed)
It was very nice to see you today!   Eat at least 3 REAL meals and 1-2 snacks per day.  Aim for no more than 5 hours between eating.  Eat breakfast within one hour of getting up.    Obtain twice as many fruits/vegetables as protein or carbohydrate foods for both lunch and dinner.   Cut down on sweet beverages. This includes juice, soda, and sweet tea.    Exercise at least 150 minutes every week.   Please let me know once you are on a stable dose of your ADHD medications.   Come back in 1 year for your next physical or sooner as needed.   Take care, Dr Jerline Pain   Preventive Care 18-39 Years, Male Preventive care refers to lifestyle choices and visits with your health care provider that can promote health and wellness. What does preventive care include?   A yearly physical exam. This is also called an annual well check.  Dental exams once or twice a year.  Routine eye exams. Ask your health care provider how often you should have your eyes checked.  Personal lifestyle choices, including: ? Daily care of your teeth and gums. ? Regular physical activity. ? Eating a healthy diet. ? Avoiding tobacco and drug use. ? Limiting alcohol use. ? Practicing safe sex. What happens during an annual well check? The services and screenings done by your health care provider during your annual well check will depend on your age, overall health, lifestyle risk factors, and family history of disease. Counseling Your health care provider may ask you questions about your:  Alcohol use.  Tobacco use.  Drug use.  Emotional well-being.  Home and relationship well-being.  Sexual activity.  Eating habits.  Work and work Statistician. Screening You may have the following tests or measurements:  Height, weight, and BMI.  Blood pressure.  Lipid and cholesterol levels. These may be checked every 5 years starting at age 3.  Diabetes screening. This is done by checking your blood  sugar (glucose) after you have not eaten for a while (fasting).  Skin check.  Hepatitis C blood test.  Hepatitis B blood test.  Sexually transmitted disease (STD) testing. Discuss your test results, treatment options, and if necessary, the need for more tests with your health care provider. Vaccines Your health care provider may recommend certain vaccines, such as:  Influenza vaccine. This is recommended every year.  Tetanus, diphtheria, and acellular pertussis (Tdap, Td) vaccine. You may need a Td booster every 10 years.  Varicella vaccine. You may need this if you have not been vaccinated.  HPV vaccine. If you are 60 or younger, you may need three doses over 6 months.  Measles, mumps, and rubella (MMR) vaccine. You may need at least one dose of MMR.You may also need a second dose.  Pneumococcal 13-valent conjugate (PCV13) vaccine. You may need this if you have certain conditions and have not been vaccinated.  Pneumococcal polysaccharide (PPSV23) vaccine. You may need one or two doses if you smoke cigarettes or if you have certain conditions.  Meningococcal vaccine. One dose is recommended if you are age 11-21 years and a first-year college student living in a residence hall, or if you have one of several medical conditions. You may also need additional booster doses.  Hepatitis A vaccine. You may need this if you have certain conditions or if you travel or work in places where you may be exposed to hepatitis A.  Hepatitis B vaccine. You may  need this if you have certain conditions or if you travel or work in places where you may be exposed to hepatitis B.  Haemophilus influenzae type b (Hib) vaccine. You may need this if you have certain risk factors. Talk to your health care provider about which screenings and vaccines you need and how often you need them. This information is not intended to replace advice given to you by your health care provider. Make sure you discuss any  questions you have with your health care provider. Document Released: 12/04/2001 Document Revised: 05/21/2017 Document Reviewed: 08/09/2015 Elsevier Interactive Patient Education  2019 [employer].

## 2019-01-15 ENCOUNTER — Encounter: Payer: Self-pay | Admitting: Family Medicine

## 2019-01-15 DIAGNOSIS — E785 Hyperlipidemia, unspecified: Secondary | ICD-10-CM | POA: Insufficient documentation

## 2019-01-15 NOTE — Progress Notes (Signed)
Please inform patient of the following:  His cholesterol was a little high, but everything else is NORMAL. Do not need to start medications. Recommend continuing to work on diet and exercise and we can recheck in a year.  Katina Degree. Jimmey Ralph, MD 01/15/2019 1:03 PM

## 2019-02-13 ENCOUNTER — Encounter: Payer: Self-pay | Admitting: Family Medicine

## 2019-02-13 LAB — T4, FREE: Free T4: 1.08

## 2019-04-13 DIAGNOSIS — H52223 Regular astigmatism, bilateral: Secondary | ICD-10-CM | POA: Diagnosis not present

## 2019-04-30 ENCOUNTER — Encounter: Payer: Self-pay | Admitting: Family Medicine

## 2019-05-05 NOTE — Telephone Encounter (Signed)
Copied from [place] [id number]. Topic: Appointment Scheduling - Scheduling Inquiry for Clinic >> May 05, 2019  4:29 PM Nils Flack wrote: Reason for CRM: pt is calling back to make appt  Please call [phone]

## 2019-05-25 ENCOUNTER — Encounter: Payer: Self-pay | Admitting: Family Medicine

## 2019-05-25 ENCOUNTER — Ambulatory Visit (INDEPENDENT_AMBULATORY_CARE_PROVIDER_SITE_OTHER): Payer: 59 | Admitting: Family Medicine

## 2019-05-25 DIAGNOSIS — N200 Calculus of kidney: Secondary | ICD-10-CM | POA: Diagnosis not present

## 2019-05-25 DIAGNOSIS — E669 Obesity, unspecified: Secondary | ICD-10-CM | POA: Diagnosis not present

## 2019-05-25 MED ORDER — OZEMPIC (0.25 OR 0.5 MG/DOSE) 2 MG/1.5ML ~~LOC~~ SOPN: 0.5000 mg | PEN_INJECTOR | SUBCUTANEOUS | 3 refills | Status: DC

## 2019-05-25 NOTE — Progress Notes (Signed)
    Chief Complaint:  Jason Wheeler is a 23 y.o. male who presents today for a virtual office visit with a chief complaint of obesity.   Assessment/Plan:  Obesity Discussed lifestyle modifications.  Discussed medication options.  Will start Ozempic 0.25mg  weekly.  Follow-up with me in a few months.  Discussed potential side effects.  Kidney stones Discussed importance of good oral hydration.  Also recommended starting potassium citrate supplement.  Gust reasons to return to care.  Follow-up as needed.     Subjective:  HPI:  Obesity  Several year history. Weight has not changed much recently. Has tried working on diet and exercise with no significant improvement.  He is interested in trying weight loss medication.  Is never been on anything like this in the past.  Tries to eat plenty of fruits and vegetables.  Tries to stay active at work.  Kidney Stones Has previously seen urology and nephrology for this.  He had urine studies done but did not have any stones that were available for analysis.  He has been trying to take plenty of fluids.  He does not have any current symptoms has not had any recent kidney stones.  Has been trying to take more fluid however admits that he should be doing a better job with this. No obvious alleviating or aggravating factors.   ROS: Per HPI  PMH: He reports that he has never smoked. He has never used smokeless tobacco. He reports current alcohol use. He reports that he does not use drugs.      Objective/Observations  Physical Exam: Wt Readings from Last 3 Encounters:  05/25/19 250 lb (113.4 kg)  01/14/19 253 lb 12.8 oz (115.1 kg)  04/28/18 250 lb (113.4 kg)  Gen: NAD, resting comfortably Pulm: Normal work of breathing Neuro: Grossly normal, moves all extremities Psych: Normal affect and thought content  Virtual Visit via Video   I connected with Jason Wheeler on 05/25/19 at 11:20 AM EDT by a video enabled telemedicine application and  verified that I am speaking with the correct person using two identifiers. I discussed the limitations of evaluation and management by telemedicine and the availability of in person appointments. The patient expressed understanding and agreed to proceed.   Patient location: Home Provider location: [place] participating in the virtual visit: Myself and Patient     Algis Greenhouse. Jerline Pain, MD 05/25/2019 11:51 AM

## 2019-05-25 NOTE — Assessment & Plan Note (Signed)
Discussed lifestyle modifications.  Discussed medication options.  Will start Ozempic 0.25mg  weekly.  Follow-up with me in a few months.  Discussed potential side effects.

## 2019-05-25 NOTE — Assessment & Plan Note (Signed)
Discussed importance of good oral hydration.  Also recommended starting potassium citrate supplement.  Gust reasons to return to care.  Follow-up as needed.

## 2019-05-26 MED FILL — OZEMPIC 0.25 OR 0.5 MG/DOSE: 2 | 28 days supply | Qty: 2 | Fill #0

## 2019-06-30 ENCOUNTER — Encounter: Payer: Self-pay | Admitting: Family Medicine

## 2019-06-30 ENCOUNTER — Other Ambulatory Visit: Payer: Self-pay

## 2019-06-30 MED ORDER — OZEMPIC (0.25 OR 0.5 MG/DOSE) 2 MG/1.5ML ~~LOC~~ SOPN: 0.5000 mg | PEN_INJECTOR | SUBCUTANEOUS | 3 refills | Status: DC

## 2019-06-30 MED FILL — OZEMPIC 0.25 OR 0.5 MG/DOSE: 2 | 28 days supply | Qty: 2 | Fill #0

## 2019-07-20 ENCOUNTER — Telehealth: Payer: Self-pay | Admitting: Physical Therapy

## 2019-07-20 ENCOUNTER — Other Ambulatory Visit: Payer: Self-pay

## 2019-07-20 MED ORDER — OZEMPIC (0.25 OR 0.5 MG/DOSE) 2 MG/1.5ML ~~LOC~~ SOPN: 0.5000 mg | PEN_INJECTOR | SUBCUTANEOUS | 3 refills | Status: DC

## 2019-07-20 NOTE — Telephone Encounter (Signed)
I have contacted the patient via MyChart. 

## 2019-07-20 NOTE — Telephone Encounter (Signed)
Copied from [place] [id number]. Topic: General - Inquiry >> Jul 20, 2019 10:17 AM Jason Wheeler A wrote: Reason for CRM: pt called in and state he is out of the Semaglutide,0.25 or 0.5MG /DOS, (OZEMPIC, 0.25 OR 0.5 MG/DOSE,) 2 MG/1.5ML SOPN .  He stated that it only last 3 weeks, he would like to know if it is to early for a refill?  He stated the time before it lasted him 4 weeks.   Pharmacy - [facility]  Best number [phone]

## 2019-07-22 MED FILL — OZEMPIC 0.25 OR 0.5 MG/DOSE: 2 | 28 days supply | Qty: 2 | Fill #1

## 2019-08-24 ENCOUNTER — Encounter: Payer: Self-pay | Admitting: Family Medicine

## 2019-08-25 ENCOUNTER — Other Ambulatory Visit: Payer: Self-pay

## 2019-08-25 MED ORDER — OZEMPIC (0.25 OR 0.5 MG/DOSE) 2 MG/1.5ML ~~LOC~~ SOPN: 0.5000 mg | PEN_INJECTOR | SUBCUTANEOUS | 3 refills | Status: DC

## 2019-08-25 MED FILL — OZEMPIC 0.25 OR 0.5 MG/DOSE: 2 | 28 days supply | Qty: 2 | Fill #0

## 2019-08-31 ENCOUNTER — Encounter: Payer: Self-pay | Admitting: Family Medicine

## 2019-08-31 ENCOUNTER — Ambulatory Visit (INDEPENDENT_AMBULATORY_CARE_PROVIDER_SITE_OTHER): Payer: 59 | Admitting: Family Medicine

## 2019-08-31 VITALS — Wt 235.0 lb

## 2019-08-31 DIAGNOSIS — F909 Attention-deficit hyperactivity disorder, unspecified type: Secondary | ICD-10-CM

## 2019-08-31 DIAGNOSIS — E669 Obesity, unspecified: Secondary | ICD-10-CM | POA: Diagnosis not present

## 2019-08-31 MED ORDER — OZEMPIC (0.25 OR 0.5 MG/DOSE) 2 MG/1.5ML ~~LOC~~ SOPN: 0.5000 mg | PEN_INJECTOR | SUBCUTANEOUS | 1 refills | Status: DC

## 2019-08-31 NOTE — Assessment & Plan Note (Addendum)
Improving.  Patient down 15 pounds since last visit.  Continue Ozempic 0.5 mg weekly.  Follow-up in 3 to 6 months.  Consider increasing dose to 1 mg weekly depending on response over the next several months.

## 2019-08-31 NOTE — Assessment & Plan Note (Signed)
Continue methylphenidate and Ritalin as per psychiatry.  Will place referral for him to establish with a specialist here in town.

## 2019-08-31 NOTE — Progress Notes (Signed)
    Chief Complaint:  Jason Wheeler is a 23 y.o. male who presents today for a virtual office visit with a chief complaint of obesity follow up .   Assessment/Plan:  Obesity Improving.  Patient down 15 pounds since last visit.  Continue Ozempic 0.5 mg weekly.  Follow-up in 3 to 6 months.  Consider increasing dose to 1 mg weekly depending on response over the next several months.  ADHD Continue methylphenidate and Ritalin as per psychiatry.  Will place referral for him to establish with a specialist here in town.     Subjective:  HPI:  His stable, chronic medical conditions are outlined below:  # Obesity - On ozempic 0.5mg  weekly and tolerating well - ROS: No constipation or nausea  # ADHD - Follows with psychiatry - On methylphenidate 36mg  daily and ritalin 5-10mg  daily as needed - Would like to be referral to psychiatrist in the area - ROS: No reported chest pain, shortness of breath, or palpitations  ROS: Per HPI  PMH: He reports that he has never smoked. He has never used smokeless tobacco. He reports current alcohol use. He reports that he does not use drugs.      Objective/Observations  Physical Exam: Gen: NAD, resting comfortably Pulm: Normal work of breathing Neuro: Grossly normal, moves all extremities Psych: Normal affect and thought content  No results found for this or any previous visit (from the past 24 hour(s)).   Virtual Visit via Video   I connected with Jason Wheeler on 08/31/19 at  8:00 AM EST by a video enabled telemedicine application and verified that I am speaking with the correct person using two identifiers. I discussed the limitations of evaluation and management by telemedicine and the availability of in person appointments. The patient expressed understanding and agreed to proceed.   Patient location: Home Provider location: [place] participating in the virtual visit: Myself and Patient     Algis Greenhouse. Jerline Pain, MD 08/31/2019 8:18 AM

## 2019-09-04 MED FILL — OZEMPIC 0.25 OR 0.5 MG/DOSE: 2 | 28 days supply | Qty: 2 | Fill #0

## 2019-09-16 ENCOUNTER — Ambulatory Visit (INDEPENDENT_AMBULATORY_CARE_PROVIDER_SITE_OTHER): Payer: 59 | Admitting: Psychiatry

## 2019-09-16 DIAGNOSIS — F900 Attention-deficit hyperactivity disorder, predominantly inattentive type: Secondary | ICD-10-CM

## 2019-09-16 MED ORDER — BUPROPION HCL ER (SR) 100 MG PO TB12: 100.0000 mg | ORAL_TABLET | Freq: Every day | ORAL | 1 refills | Status: DC

## 2019-09-16 NOTE — Progress Notes (Signed)
Psychiatric Initial Adult Assessment   Patient Identification: Jason Wheeler MRN:  [id number] Date of Evaluation:  09/16/2019 Referral Source: primary care Chief Complaint:  Inattention Visit Diagnosis:    ICD-10-CM   1. Attention deficit hyperactivity disorder (ADHD), predominantly inattentive type  F90.0    I connected with Aldin Schaner on 09/16/19 at  9:00 AM EST by a video enabled telemedicine application and verified that I am speaking with the correct person using two identifiers.   I discussed the limitations of evaluation and management by telemedicine and the availability of in person appointments. The patient expressed understanding and agreed to proceed.  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  Referred by primary care physician for management of ADHD  Patient was getting ADHD meds from [facility] from [place] for last 2 years and have been tested and diagnosed with ADHD when he joined college and was struggling with attention focusing completing task he had to get accommodation for extra time for exams and settings.  Medication including Ritalin, Concerta Adderall but he felt somewhat as if the provider does not listen and did not answer questions when she will change the medication.  He is currently on weight loss medication and [place] he wants to change services since he is working with [place] and does not want to commute to [place]  He is taking sporadically the medication more so when there is an exam setting or he has to sit in a class but otherwise he was not taking it regularly Recently with concerta he felt increase in heart rate and stopped. He is currently on weight loss med for last 2 months  Referred for management of ADHD  Denies episodes of depression denies episodes of extreme anxiety denies  psychotic symptoms or manic symptoms no past treatment of depression  He was bullied when he was in middle school but in high school he did fine he did struggle but was never diagnosed with ADHD  Modifying factor; soccer, golf, dog Aggravating factor: struggles with exam and in distraction places  Denies past pscyh admission. No suicide attempt     Past Psychiatric History: ADHD  Previous Psychotropic Medications: Yes   Substance Abuse History in the last 12 months:  No.  Consequences of Substance Abuse: NA  Past Medical History:  Past Medical History:  Diagnosis Date  . ADHD (attention deficit hyperactivity disorder)   . Chest wall pain   . Renal disorder    kidney stones    Past Surgical History:  Procedure Laterality Date  . TONSILLECTOMY      Family Psychiatric History: sister : adhd  Family History:  Family History  Problem Relation Age of Onset  . Diabetes Mother   . Hypertension Mother   . Miscarriages / [place] Mother   . Hypertension Father   . Throat cancer Maternal Grandfather   . Heart disease Paternal Grandmother   . Stroke Paternal Grandmother   . Prostate cancer Neg Hx   . Colon cancer Neg Hx     Social History:   Social History   Socioeconomic History  . Marital status: Single    Spouse name: Not on file  . Number of children: Not on file  . Years of education: Not on file  . Highest education level: Not on file  Occupational History  . Not on file  Social Needs  . Financial resource strain: Not on file  . Food insecurity    Worry: Not on file    Inability: Not on file  . Transportation needs    Medical: Not on file    Non-medical: Not on file  Tobacco Use  . Smoking status: Never Smoker  . Smokeless tobacco: Never Used  Substance and Sexual Activity  . Alcohol use: Yes    Comment: 1 beer once a month  . Drug use: Never  . Sexual activity: Not on file  Lifestyle  . Physical activity    Days per week: Not on file     Minutes per session: Not on file  . Stress: Not on file  Relationships  . Social Musicianconnections    Talks on phone: Not on file    Gets together: Not on file    Attends religious service: Not on file    Active member of club or organization: Not on file    Attends meetings of clubs or organizations: Not on file    Relationship status: Not on file  Other Topics Concern  . Not on file  Social History Narrative  . Not on file    Additional Social History: grew up with parents. No trauma  Allergies:  No Known Allergies  Metabolic Disorder Labs: Lab Results  Component Value Date   HGBA1C 5.2 01/14/2015   No results found for: PROLACTIN Lab Results  Component Value Date   CHOL 221 (H) 01/14/2019   TRIG 116.0 01/14/2019   HDL 30.50 (L) 01/14/2019   CHOLHDL 7 01/14/2019   VLDL 23.2 01/14/2019   LDLCALC 167 (H) 01/14/2019   Lab Results  Component Value Date   TSH 1.68 01/14/2015    Therapeutic Level Labs: No results found for: LITHIUM No results found for: CBMZ No results found for: VALPROATE  Current Medications: Current Outpatient Medications  Medication Sig Dispense Refill  . buPROPion (WELLBUTRIN SR) 100 MG 12 hr tablet Take 1 tablet (100 mg total) by mouth daily. 30 tablet 1  . Semaglutide,0.25 or 0.5MG /DOS, (OZEMPIC, 0.25 OR 0.5 MG/DOSE,) 2 MG/1.5ML SOPN Inject 0.5 mg into the skin once a week. 3 pen 1   No current facility-administered medications for this visit.       Psychiatric Specialty Exam: Review of Systems  Cardiovascular: Negative for chest pain.  Psychiatric/Behavioral: Negative for depression and suicidal ideas.    There were no vitals taken for this visit.There is no height or weight on file to calculate BMI.  General Appearance: Casual  Eye Contact:  Fair  Speech:  Normal Rate  Volume:  Decreased  Mood:  Euthymic  Affect:  Congruent  Thought Process:  Goal Directed  Orientation:  Full (Time, Place, and Person)  Thought Content:  Logical   Suicidal Thoughts:  No  Homicidal Thoughts:  No  Memory:  Immediate;   Fair Recent;   Fair  Judgement:  Fair  Insight:  Fair  Psychomotor Activity:  Normal  Concentration:  Concentration: Fair and Attention Span: Fair  Recall:  FiservFair  Fund of Knowledge:Good  Language: Good  Akathisia:  No  Handed:  Right  AIMS (if indicated):  not done  Assets:  Desire for Improvement Physical Health  ADL's:  Intact  Cognition: WNL  Sleep:  Fair   Screenings: PHQ2-9     [facility]  PHQ-2 Total Score  0      Assessment and Plan: as follows ADHD;  discussed options and considering being on weight loss med. Preferably consider wellbutrin for possible inattention. He functions well at job but struggle in exam settings and around group when he has to focus  If need can change dose or med to adderall or other stimulant meds, patien to review med with primary care if needed as well since he is on weight loss med ozempic Patient also states once he is on ADHD med and doing well can start getting refill from primary care.   I discussed the assessment and treatment plan with the patient. The patient was provided an opportunity to ask questions and all were answered. The patient agreed with the plan and demonstrated an understanding of the instructions.   The patient was advised to call back or seek an in-person evaluation if the symptoms worsen or if the condition fails to improve as anticipated.  I provided 50  minutes of non-face-to-face time during this encounter.    Thresa Ross, MD 11/25/20209:26 AM

## 2019-10-05 ENCOUNTER — Encounter: Payer: Self-pay | Admitting: Family Medicine

## 2019-10-07 ENCOUNTER — Other Ambulatory Visit: Payer: Self-pay

## 2019-10-07 MED ORDER — OZEMPIC (0.25 OR 0.5 MG/DOSE) 2 MG/1.5ML ~~LOC~~ SOPN: 0.5000 mg | PEN_INJECTOR | SUBCUTANEOUS | 2 refills | Status: DC

## 2019-10-08 ENCOUNTER — Other Ambulatory Visit: Payer: Self-pay

## 2019-10-08 MED ORDER — OZEMPIC (1 MG/DOSE) 2 MG/1.5ML ~~LOC~~ SOPN: 1.0000 mg | PEN_INJECTOR | SUBCUTANEOUS | 2 refills | Status: AC

## 2019-10-08 NOTE — Progress Notes (Signed)
ozz

## 2019-10-29 ENCOUNTER — Other Ambulatory Visit: Payer: Self-pay

## 2019-10-29 ENCOUNTER — Encounter (HOSPITAL_COMMUNITY): Payer: Self-pay | Admitting: Psychiatry

## 2019-10-29 ENCOUNTER — Ambulatory Visit (INDEPENDENT_AMBULATORY_CARE_PROVIDER_SITE_OTHER): Payer: 59 | Admitting: Psychiatry

## 2019-10-29 DIAGNOSIS — F900 Attention-deficit hyperactivity disorder, predominantly inattentive type: Secondary | ICD-10-CM | POA: Diagnosis not present

## 2019-10-29 MED ORDER — ATOMOXETINE HCL 18 MG PO CAPS: 18.0000 mg | ORAL_CAPSULE | Freq: Every day | ORAL | 0 refills | Status: DC

## 2019-10-29 NOTE — Progress Notes (Signed)
[facility] Follow up visit  Patient Identification: Jason Wheeler MRN:  [id number] Date of Evaluation:  10/29/2019 Referral Source: primary care Chief Complaint:  Inattention Visit Diagnosis:    ICD-10-CM   1. Attention deficit hyperactivity disorder (ADHD), predominantly inattentive type  F90.0    I connected with Mando Broughton on 10/29/19 at  3:00 PM EST by a video enabled telemedicine application and verified that I am speaking with the correct person using two identifiers.   I discussed the limitations of evaluation and management by telemedicine and the availability of in person appointments. The patient expressed understanding and agreed to proceed.  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  Referred by primary care physician for management of ADHD  Patient was getting ADHD meds from [facility] from [facility] for last 2 years and have been tested and diagnosed with ADHD   Medication including Ritalin, Concerta Adderall but he felt somewhat as if the provider does not listen and did not answer questions when she will change the medication.  He is currently on weight loss medication ozempic concerta made his heart go faster Last visit we added wellbutrin, didn't notice much change and wants to stop it    He was bullied when he was in middle school but in high school he did fine he did struggle but was never diagnosed with ADHD  Modifying factor; soccer, golf, dog Aggravating factor: struggles with exam and in distraction places  Denies past pscyh admission. No suicide attempt     Past Psychiatric History: ADHD    Past Medical History:  Past Medical History:  Diagnosis Date  . ADHD (attention deficit hyperactivity disorder)   . Chest wall pain   . Renal disorder    kidney stones    Past Surgical History:  Procedure Laterality  Date  . TONSILLECTOMY      Family Psychiatric History: sister : adhd  Family History:  Family History  Problem Relation Age of Onset  . Diabetes Mother   . Hypertension Mother   . Miscarriages / [place] Mother   . Hypertension Father   . Throat cancer Maternal Grandfather   . Heart disease Paternal Grandmother   . Stroke Paternal Grandmother   . Prostate cancer Neg Hx   . Colon cancer Neg Hx     Social History:   Social History   Socioeconomic History  . Marital status: Single    Spouse name: Not on file  . Number of children: Not on file  . Years of education: Not on file  . Highest education level: Not on file  Occupational History  . Not on file  Tobacco Use  . Smoking status: Never Smoker  . Smokeless tobacco: Never Used  Substance and Sexual Activity  . Alcohol use: Yes    Comment: 1 beer once a month  . Drug use: Never  . Sexual activity: Not on file  Other Topics Concern  . Not on file  Social History Narrative  . Not on file   Social Determinants of Health   Financial Resource Strain:   . Difficulty of Paying Living Expenses: Not on file  Food Insecurity:   . Worried About Programme researcher, broadcasting/film/video in the Last Year: Not on file  . Ran Out of Food in the Last Year: Not on file  Transportation Needs:   . Lack of Transportation (  Medical): Not on file  . Lack of Transportation (Non-Medical): Not on file  Physical Activity:   . Days of Exercise per Week: Not on file  . Minutes of Exercise per Session: Not on file  Stress:   . Feeling of Stress : Not on file  Social Connections:   . Frequency of Communication with Friends and Family: Not on file  . Frequency of Social Gatherings with Friends and Family: Not on file  . Attends Religious Services: Not on file  . Active Member of Clubs or Organizations: Not on file  . Attends Banker Meetings: Not on file  . Marital Status: Not on file     Allergies:  No Known Allergies  Metabolic  Disorder Labs: Lab Results  Component Value Date   HGBA1C 5.2 01/14/2015   No results found for: PROLACTIN Lab Results  Component Value Date   CHOL 221 (H) 01/14/2019   TRIG 116.0 01/14/2019   HDL 30.50 (L) 01/14/2019   CHOLHDL 7 01/14/2019   VLDL 23.2 01/14/2019   LDLCALC 167 (H) 01/14/2019   Lab Results  Component Value Date   TSH 1.68 01/14/2015    Therapeutic Level Labs: No results found for: LITHIUM No results found for: CBMZ No results found for: VALPROATE  Current Medications: Current Outpatient Medications  Medication Sig Dispense Refill  . atomoxetine (STRATTERA) 18 MG capsule Take 1 capsule (18 mg total) by mouth daily. 30 capsule 0  . Semaglutide, 1 MG/DOSE, (OZEMPIC, 1 MG/DOSE,) 2 MG/1.5ML SOPN Inject 1 mg into the skin once a week. 3 pen 2  . Semaglutide,0.25 or 0.5MG /DOS, (OZEMPIC, 0.25 OR 0.5 MG/DOSE,) 2 MG/1.5ML SOPN Inject 0.5 mg into the skin once a week. 3 pen 2   No current facility-administered medications for this visit.      Psychiatric Specialty Exam: Review of Systems  Cardiovascular: Negative for chest pain.  Psychiatric/Behavioral: Negative for depression and suicidal ideas.    There were no vitals taken for this visit.There is no height or weight on file to calculate BMI.  General Appearance: Casual  Eye Contact:  Fair  Speech:  Normal Rate  Volume:  Decreased  Mood:  fair  Affect:  Congruent  Thought Process:  Goal Directed  Orientation:  Full (Time, Place, and Person)  Thought Content:  Logical  Suicidal Thoughts:  No  Homicidal Thoughts:  No  Memory:  Immediate;   Fair Recent;   Fair  Judgement:  Fair  Insight:  Fair  Psychomotor Activity:  Normal  Concentration:  Concentration: Fair and Attention Span: Fair  Recall:  [employer] of Knowledge:Good  Language: Good  Akathisia:  No  Handed:  Right  AIMS (if indicated):  not done  Assets:  Desire for Improvement Physical Health  ADL's:  Intact  Cognition: WNL  Sleep:   Fair   Screenings: PHQ2-9     Office Visit from 01/14/2019 in [facility]  PHQ-2 Total Score  0      Assessment and Plan: as follows ADHD; discussed options and considering being on weight loss med. Doesn't want to be on wellbutrin  Will start strattera 18mg  increase to 36mg  in one week if tolerates or call back for concerns  If need can change dose or med to adderall or other stimulant meds, patien to review med with primary care if needed as well since he is on weight loss med ozempic Patient also states once he is on ADHD med and doing well can start  getting refill from primary care.   I discussed the assessment and treatment plan with the patient. The patient was provided an opportunity to ask questions and all were answered. The patient agreed with the plan and demonstrated an understanding of the instructions.   The patient was advised to call back or seek an in-person evaluation if the symptoms worsen or if the condition fails to improve as anticipated. Fu 4w. Meds side effects reviewed   Merian Capron, MD 1/7/20213:07 PM

## 2019-12-11 ENCOUNTER — Encounter (HOSPITAL_COMMUNITY): Payer: Self-pay | Admitting: Psychiatry

## 2019-12-11 ENCOUNTER — Ambulatory Visit (INDEPENDENT_AMBULATORY_CARE_PROVIDER_SITE_OTHER): Payer: 59 | Admitting: Psychiatry

## 2019-12-11 DIAGNOSIS — F900 Attention-deficit hyperactivity disorder, predominantly inattentive type: Secondary | ICD-10-CM

## 2019-12-11 MED ORDER — ATOMOXETINE HCL 40 MG PO CAPS: 40.0000 mg | ORAL_CAPSULE | Freq: Every day | ORAL | 1 refills | Status: DC

## 2019-12-11 NOTE — Progress Notes (Signed)
[facility] Follow up visit  Patient Identification: Jason Wheeler MRN:  [id number] Date of Evaluation:  12/11/2019 Referral Source: primary care Chief Complaint:  Inattention Visit Diagnosis:    ICD-10-CM   1. Attention deficit hyperactivity disorder (ADHD), predominantly inattentive type  F90.0     I connected with Keyon Kassabian on 12/11/19 at  9:00 AM EST by a video enabled telemedicine application and verified that I am speaking with the correct person using two identifiers.      I discussed the limitations of evaluation and management by telemedicine and the availability of in person appointments. The patient expressed understanding and agreed to proceed.  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  Referred by primary care physician for management of ADHD  strattera started last visit has helped, says its expensive but will try manage it.  Has to take 2 of 18mg    He is currently on weight loss medication ozempic Says apetite is low now and may stop this med concerta made his heart go faster   Modifying factor; soccer, golf, dog Aggravating factor: struggles with exam and in distraction places  Denies past pscyh admission. No suicide attempt     Past Psychiatric History: ADHD    Past Medical History:  Past Medical History:  Diagnosis Date  . ADHD (attention deficit hyperactivity disorder)   . Chest wall pain   . Renal disorder    kidney stones    Past Surgical History:  Procedure Laterality Date  . TONSILLECTOMY      Family Psychiatric History: sister : adhd  Family History:  Family History  Problem Relation Age of Onset  . Diabetes Mother   . Hypertension Mother   . Miscarriages / Mother   . Hypertension Father   . Throat cancer Maternal Grandfather   . Heart disease Paternal Grandmother   . Stroke Paternal  Grandmother   . Prostate cancer Neg Hx   . Colon cancer Neg Hx     Social History:   Social History   Socioeconomic History  . Marital status: Single    Spouse name: Not on file  . Number of children: Not on file  . Years of education: Not on file  . Highest education level: Not on file  Occupational History  . Not on file  Tobacco Use  . Smoking status: Never Smoker  . Smokeless tobacco: Never Used  Substance and Sexual Activity  . Alcohol use: Yes    Comment: 1 beer once a month  . Drug use: Never  . Sexual activity: Not on file  Other Topics Concern  . Not on file  Social History Narrative  . Not on file   Social Determinants of Health   Financial Resource Strain:   . Difficulty of Paying Living Expenses: Not on file  Food Insecurity:   . Worried About [place] in the Last Year: Not on file  . Ran Out of Food in the Last Year: Not on file  Transportation Needs:   . Lack of Transportation (Medical): Not on file  . Lack of Transportation (Non-Medical): Not on file  Physical Activity:   . Days of Exercise per Week: Not on file  . Minutes of Exercise per Session: Not on file  Stress:   . Feeling of Stress : Not on file  Social Connections:   . Frequency  of Communication with Friends and Family: Not on file  . Frequency of Social Gatherings with Friends and Family: Not on file  . Attends Religious Services: Not on file  . Active Member of Clubs or Organizations: Not on file  . Attends Archivist Meetings: Not on file  . Marital Status: Not on file     Allergies:  No Known Allergies  Metabolic Disorder Labs: Lab Results  Component Value Date   HGBA1C 5.2 01/14/2015   No results found for: PROLACTIN Lab Results  Component Value Date   CHOL 221 (H) 01/14/2019   TRIG 116.0 01/14/2019   HDL 30.50 (L) 01/14/2019   CHOLHDL 7 01/14/2019   VLDL 23.2 01/14/2019   LDLCALC 167 (H) 01/14/2019   Lab Results  Component Value Date   TSH 1.68  01/14/2015    Therapeutic Level Labs: No results found for: LITHIUM No results found for: CBMZ No results found for: VALPROATE  Current Medications: Current Outpatient Medications  Medication Sig Dispense Refill  . atomoxetine (STRATTERA) 40 MG capsule Take 1 capsule (40 mg total) by mouth daily. Give generic 30 capsule 1  . Semaglutide, 1 MG/DOSE, (OZEMPIC, 1 MG/DOSE,) 2 MG/1.5ML SOPN Inject 1 mg into the skin once a week. 3 pen 2  . Semaglutide,0.25 or 0.5MG /DOS, (OZEMPIC, 0.25 OR 0.5 MG/DOSE,) 2 MG/1.5ML SOPN Inject 0.5 mg into the skin once a week. 3 pen 2   No current facility-administered medications for this visit.      Psychiatric Specialty Exam: Review of Systems  Cardiovascular: Negative for chest pain.  Psychiatric/Behavioral: Negative for depression and suicidal ideas.    There were no vitals taken for this visit.There is no height or weight on file to calculate BMI.  General Appearance: Casual  Eye Contact:  Fair  Speech:  Normal Rate  Volume:  Decreased  Mood: fair  Affect:  Congruent  Thought Process:  Goal Directed  Orientation:  Full (Time, Place, and Person)  Thought Content:  Logical  Suicidal Thoughts:  No  Homicidal Thoughts:  No  Memory:  Immediate;   Fair Recent;   Fair  Judgement:  Fair  Insight:  Fair  Psychomotor Activity:  Normal  Concentration:  Concentration: Fair and Attention Span: Fair  Recall:  [employer] of Knowledge:Good  Language: Good  Akathisia:  No  Handed:  Right  AIMS (if indicated):  not done  Assets:  Desire for Improvement Physical Health  ADL's:  Intact  Cognition: WNL  Sleep:  Fair   Screenings: PHQ2-9     Office Visit from 01/14/2019 in [place]  PHQ-2 Total Score  0      Assessment and Plan: as follows ADHD; improved on strattera, change to 40mg  . Will write generic ok Still talked about adderall as option if needed  He may cut down ozempic for weight loss as apetite has  decreased  I discussed the assessment and treatment plan with the patient. The patient was provided an opportunity to ask questions and all were answered. The patient agreed with the plan and demonstrated an understanding of the instructions.   The patient was advised to call back or seek an in-person evaluation if the symptoms worsen or if the condition fails to improve as anticipated. Fu 23m.    Merian Capron, MD 2/19/20219:07 AM

## 2020-01-05 ENCOUNTER — Telehealth: Payer: Self-pay | Admitting: Family Medicine

## 2020-01-05 NOTE — Telephone Encounter (Signed)
Patient notified and voices understanding 

## 2020-01-05 NOTE — Telephone Encounter (Signed)
Pt called stating he has been taking ozempic. Pt states he is experiencing pain on right side of abdomen. No rash or redness. Pt asked if CMA or Dr. Jimmey Ralph to see if he may be giving himself the shot incorrectly or if he is having a reaction. Please advise.

## 2020-01-05 NOTE — Telephone Encounter (Signed)
Could be due to side effects of ozempic. Recommend that he decrease dose to 0.25mg  next time he takes it. Would like to see him for OV if not improving.  Katina Degree. Jimmey Ralph, MD 01/05/2020 12:52 PM

## 2020-01-05 NOTE — Telephone Encounter (Signed)
Patient stated he is having pain in his abdomen on the right side,he is unsure if its from the Rx Ozempic.Informed patient to schedule a appt. Please advise

## 2020-02-05 ENCOUNTER — Ambulatory Visit (INDEPENDENT_AMBULATORY_CARE_PROVIDER_SITE_OTHER): Payer: 59 | Admitting: Psychiatry

## 2020-02-05 ENCOUNTER — Encounter (HOSPITAL_COMMUNITY): Payer: Self-pay | Admitting: Psychiatry

## 2020-02-05 DIAGNOSIS — F900 Attention-deficit hyperactivity disorder, predominantly inattentive type: Secondary | ICD-10-CM

## 2020-02-05 MED ORDER — ATOMOXETINE HCL 40 MG PO CAPS: 40.0000 mg | ORAL_CAPSULE | Freq: Every day | ORAL | 2 refills | Status: DC

## 2020-02-05 NOTE — Progress Notes (Signed)
[facility] Follow up visit  Patient Identification: Jason Wheeler MRN:  [id number] Date of Evaluation:  02/05/2020 Referral Source: primary care Chief Complaint:  Inattention follow up  Visit Diagnosis:    ICD-10-CM   1. Attention deficit hyperactivity disorder (ADHD), predominantly inattentive type  F90.0       I connected with Jason Wheeler on 02/05/20 at 10:00 AM EDT by a video enabled telemedicine application and verified that I am speaking with the correct person using two identifiers.     I discussed the limitations of evaluation and management by telemedicine and the availability of in person appointments. The patient expressed understanding and agreed to proceed.  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  Referred by primary care physician for management of ADHD  Increased strattera to 40mg  is helping. More focused when on exam and at work Takes prn now and have nearly stopped ozempic  Mood fair  Modifying factor; soccer, golf, dog Aggravating factor: exam struggles, but better Denies past pscyh admission. No suicide attempt     Past Psychiatric History: ADHD    Past Medical History:  Past Medical History:  Diagnosis Date  . ADHD (attention deficit hyperactivity disorder)   . Chest wall pain   . Renal disorder    kidney stones    Past Surgical History:  Procedure Laterality Date  . TONSILLECTOMY      Family Psychiatric History: sister : adhd  Family History:  Family History  Problem Relation Age of Onset  . Diabetes Mother   . Hypertension Mother   . Miscarriages / [place] Mother   . Hypertension Father   . Throat cancer Maternal Grandfather   . Heart disease Paternal Grandmother   . Stroke Paternal Grandmother   . Prostate cancer Neg Hx   . Colon cancer Neg Hx     Social History:   Social History    Socioeconomic History  . Marital status: Single    Spouse name: Not on file  . Number of children: Not on file  . Years of education: Not on file  . Highest education level: Not on file  Occupational History  . Not on file  Tobacco Use  . Smoking status: Never Smoker  . Smokeless tobacco: Never Used  Substance and Sexual Activity  . Alcohol use: Yes    Comment: 1 beer once a month  . Drug use: Never  . Sexual activity: Not on file  Other Topics Concern  . Not on file  Social History Narrative  . Not on file   Social Determinants of Health   Financial Resource Strain:   . Difficulty of Paying Living Expenses:   Food Insecurity:   . Worried About Charity fundraiser in the Last Year:   . Arboriculturist in the Last Year:   Transportation Needs:   . Film/video editor (Medical):   Marland Kitchen Lack of Transportation (Non-Medical):   Physical Activity:   . Days of Exercise per Week:   . Minutes of Exercise per Session:   Stress:   . Feeling of Stress :   Social Connections:   . Frequency of Communication with Friends and Family:   . Frequency of Social Gatherings with Friends and Family:   . Attends Religious Services:   . Active Member of Clubs or Organizations:   . Attends Archivist Meetings:   .  Marital Status:      Allergies:  No Known Allergies  Metabolic Disorder Labs: Lab Results  Component Value Date   HGBA1C 5.2 01/14/2015   No results found for: PROLACTIN Lab Results  Component Value Date   CHOL 221 (H) 01/14/2019   TRIG 116.0 01/14/2019   HDL 30.50 (L) 01/14/2019   CHOLHDL 7 01/14/2019   VLDL 23.2 01/14/2019   LDLCALC 167 (H) 01/14/2019   Lab Results  Component Value Date   TSH 1.68 01/14/2015    Therapeutic Level Labs: No results found for: LITHIUM No results found for: CBMZ No results found for: VALPROATE  Current Medications: Current Outpatient Medications  Medication Sig Dispense Refill  . atomoxetine (STRATTERA) 40 MG  capsule Take 1 capsule (40 mg total) by mouth daily. Give generic 30 capsule 2  . Semaglutide,0.25 or 0.5MG /DOS, (OZEMPIC, 0.25 OR 0.5 MG/DOSE,) 2 MG/1.5ML SOPN Inject 0.5 mg into the skin once a week. 3 pen 2   No current facility-administered medications for this visit.      Psychiatric Specialty Exam: Review of Systems  Cardiovascular: Negative for chest pain.  Psychiatric/Behavioral: Negative for depression and suicidal ideas.    There were no vitals taken for this visit.There is no height or weight on file to calculate BMI.  General Appearance: Casual  Eye Contact:  Fair  Speech:  Normal Rate  Volume:  Decreased  Mood:  fair  Affect:  Congruent  Thought Process:  Goal Directed  Orientation:  Full (Time, Place, and Person)  Thought Content:  Logical  Suicidal Thoughts:  No  Homicidal Thoughts:  No  Memory:  Immediate;   Fair Recent;   Fair  Judgement:  Fair  Insight:  Fair  Psychomotor Activity:  Normal  Concentration:  Concentration: Fair and Attention Span: Fair  Recall:  [employer] of Knowledge:Good  Language: Good  Akathisia:  No  Handed:  Right  AIMS (if indicated):  not done  Assets:  Desire for Improvement Physical Health  ADL's:  Intact  Cognition: WNL  Sleep:  Fair   Screenings: PHQ2-9     Office Visit from 01/14/2019 in [facility]  PHQ-2 Total Score  0      Assessment and Plan: as follows ADHD;improved, continue strattera 40mg    I discussed the assessment and treatment plan with the patient. The patient was provided an opportunity to ask questions and all were answered. The patient agreed with the plan and demonstrated an understanding of the instructions.   The patient was advised to call back or seek an in-person evaluation if the symptoms worsen or if the condition fails to improve as anticipated.  Non face to face time spent or less  Fu 53m.   1m, MD 4/16/202110:04 AM

## 2020-05-06 ENCOUNTER — Encounter (HOSPITAL_COMMUNITY): Payer: Self-pay | Admitting: Psychiatry

## 2020-05-06 ENCOUNTER — Telehealth (INDEPENDENT_AMBULATORY_CARE_PROVIDER_SITE_OTHER): Payer: 59 | Admitting: Psychiatry

## 2020-05-06 DIAGNOSIS — F900 Attention-deficit hyperactivity disorder, predominantly inattentive type: Secondary | ICD-10-CM

## 2020-05-06 MED ORDER — ATOMOXETINE HCL 40 MG PO CAPS: 40.0000 mg | ORAL_CAPSULE | Freq: Every day | ORAL | 1 refills | Status: DC

## 2020-05-06 NOTE — Progress Notes (Signed)
[facility] Follow up visit  Patient Identification: Jason Wheeler MRN:  [id number] Date of Evaluation:  05/06/2020 Referral Source: primary care Chief Complaint:  Inattention follow up  Visit Diagnosis:    ICD-10-CM   1. Attention deficit hyperactivity disorder (ADHD), predominantly inattentive type  F90.0       I connected with Korver Lipsett on 05/06/20 at  8:30 AM EDT by a video enabled telemedicine application and verified that I am speaking with the correct person using two identifiers.   Patient location home Provider location home     I discussed the limitations of evaluation and management by telemedicine and the availability of in person appointments. The patient expressed understanding and agreed to proceed.  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  Referred by primary care physician for management of ADHD  strattera helps focus and also during exam, taking drug holidays since not in school. Waiting to join Nursing school Mood fair  Modifying factor; soccer, golf, dog Aggravating factor: exam struggles Denies past pscyh admission. No suicide attempt     Past Psychiatric History: ADHD    Past Medical History:  Past Medical History:  Diagnosis Date  . ADHD (attention deficit hyperactivity disorder)   . Chest wall pain   . Renal disorder    kidney stones    Past Surgical History:  Procedure Laterality Date  . TONSILLECTOMY      Family Psychiatric History: sister : adhd  Family History:  Family History  Problem Relation Age of Onset  . Diabetes Mother   . Hypertension Mother   . Miscarriages / [place] Mother   . Hypertension Father   . Throat cancer Maternal Grandfather   . Heart disease Paternal Grandmother   . Stroke Paternal Grandmother   . Prostate cancer Neg Hx   . Colon cancer Neg Hx     Social History:    Social History   Socioeconomic History  . Marital status: Single    Spouse name: Not on file  . Number of children: Not on file  . Years of education: Not on file  . Highest education level: Not on file  Occupational History  . Not on file  Tobacco Use  . Smoking status: Never Smoker  . Smokeless tobacco: Never Used  Substance and Sexual Activity  . Alcohol use: Yes    Comment: 1 beer once a month  . Drug use: Never  . Sexual activity: Not on file  Other Topics Concern  . Not on file  Social History Narrative  . Not on file   Social Determinants of Health   Financial Resource Strain:   . Difficulty of Paying Living Expenses:   Food Insecurity:   . Worried About Programme researcher, broadcasting/film/video in the Last Year:   . Barista in the Last Year:   Transportation Needs:   . Freight forwarder (Medical):   Marland Kitchen Lack of Transportation (Non-Medical):   Physical Activity:   . Days of Exercise per Week:   . Minutes of Exercise per Session:   Stress:   . Feeling of Stress :   Social Connections:   . Frequency of Communication with Friends and Family:   . Frequency of Social Gatherings with Friends and Family:   . Attends Religious Services:   . Active Member of Clubs or Organizations:   . Attends Club  or Organization Meetings:   Marland Kitchen Marital Status:      Allergies:  No Known Allergies  Metabolic Disorder Labs: Lab Results  Component Value Date   HGBA1C 5.2 01/14/2015   No results found for: PROLACTIN Lab Results  Component Value Date   CHOL 221 (H) 01/14/2019   TRIG 116.0 01/14/2019   HDL 30.50 (L) 01/14/2019   CHOLHDL 7 01/14/2019   VLDL 23.2 01/14/2019   LDLCALC 167 (H) 01/14/2019   Lab Results  Component Value Date   TSH 1.68 01/14/2015    Therapeutic Level Labs: No results found for: LITHIUM No results found for: CBMZ No results found for: VALPROATE  Current Medications: Current Outpatient Medications  Medication Sig Dispense Refill  . atomoxetine  (STRATTERA) 40 MG capsule Take 1 capsule (40 mg total) by mouth daily. Give generic 30 capsule 1  . Semaglutide,0.25 or 0.5MG /DOS, (OZEMPIC, 0.25 OR 0.5 MG/DOSE,) 2 MG/1.5ML SOPN Inject 0.5 mg into the skin once a week. 3 pen 2   No current facility-administered medications for this visit.      Psychiatric Specialty Exam: Review of Systems  Cardiovascular: Negative for chest pain.  Psychiatric/Behavioral: Negative for depression and suicidal ideas.    There were no vitals taken for this visit.There is no height or weight on file to calculate BMI.  General Appearance: Casual  Eye Contact:  Fair  Speech:  Normal Rate  Volume:  Decreased  Mood:  fair  Affect:  Congruent  Thought Process:  Goal Directed  Orientation:  Full (Time, Place, and Person)  Thought Content:  Logical  Suicidal Thoughts:  No  Homicidal Thoughts:  No  Memory:  Immediate;   Fair Recent;   Fair  Judgement:  Fair  Insight:  Fair  Psychomotor Activity:  Normal  Concentration:  Concentration: Fair and Attention Span: Fair  Recall:  [employer] of Knowledge:Good  Language: Good  Akathisia:  No  Handed:  Right  AIMS (if indicated):  not done  Assets:  Desire for Improvement Physical Health  ADL's:  Intact  Cognition: WNL  Sleep:  Fair   Screenings: PHQ2-9     Office Visit from 01/14/2019 in [facility]  PHQ-2 Total Score 0      Assessment and Plan: as follows ADHD;manageable with strattera, continue , no headaches or side effects   I discussed the assessment and treatment plan with the patient. The patient was provided an opportunity to ask questions and all were answered. The patient agreed with the plan and demonstrated an understanding of the instructions.   The patient was advised to call back or seek an in-person evaluation if the symptoms worsen or if the condition fails to improve as anticipated.  Non face to face time spent or less  Fu 6m.   Thresa Ross,  MD 7/16/20218:38 AM

## 2020-09-02 ENCOUNTER — Encounter (HOSPITAL_COMMUNITY): Payer: Self-pay | Admitting: Psychiatry

## 2020-09-02 ENCOUNTER — Telehealth (INDEPENDENT_AMBULATORY_CARE_PROVIDER_SITE_OTHER): Payer: 59 | Admitting: Psychiatry

## 2020-09-02 DIAGNOSIS — F900 Attention-deficit hyperactivity disorder, predominantly inattentive type: Secondary | ICD-10-CM

## 2020-09-02 NOTE — Progress Notes (Signed)
[facility] Follow up visit  Patient Identification: Jason Wheeler MRN:  [id number] Date of Evaluation:  09/02/2020 Referral Source: primary care Chief Complaint:  Inattention follow up  Visit Diagnosis:    ICD-10-CM   1. Attention deficit hyperactivity disorder (ADHD), predominantly inattentive type  F90.0       I connected with Jason Wheeler on 09/02/20 at  8:30 AM EST by telephone and verified that I am speaking with the correct person using two identifiers.    Patient location home Provider location home office     I discussed the limitations of evaluation and management by telemedicine and the availability of in person appointments. The patient expressed understanding and agreed to proceed.  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  Referred by primary care physician for management of ADHD  Continues to do well on strattera, no side effects Planning to join nursing school Plans to stop ozempic for weight loss soon  Modifying factor; soccer, golf, dog Aggravating factor: exam struggles Denies past pscyh admission. No suicide attempt     Past Psychiatric History: ADHD    Past Medical History:  Past Medical History:  Diagnosis Date  . ADHD (attention deficit hyperactivity disorder)   . Chest wall pain   . Renal disorder    kidney stones    Past Surgical History:  Procedure Laterality Date  . TONSILLECTOMY      Family Psychiatric History: sister : adhd  Family History:  Family History  Problem Relation Age of Onset  . Diabetes Mother   . Hypertension Mother   . Miscarriages / [place] Mother   . Hypertension Father   . Throat cancer Maternal Grandfather   . Heart disease Paternal Grandmother   . Stroke Paternal Grandmother   . Prostate cancer Neg Hx   . Colon cancer Neg Hx     Social History:   Social History    Socioeconomic History  . Marital status: Single    Spouse name: Not on file  . Number of children: Not on file  . Years of education: Not on file  . Highest education level: Not on file  Occupational History  . Not on file  Tobacco Use  . Smoking status: Never Smoker  . Smokeless tobacco: Never Used  Substance and Sexual Activity  . Alcohol use: Yes    Comment: 1 beer once a month  . Drug use: Never  . Sexual activity: Not on file  Other Topics Concern  . Not on file  Social History Narrative  . Not on file   Social Determinants of Health   Financial Resource Strain:   . Difficulty of Paying Living Expenses: Not on file  Food Insecurity:   . Worried About Programme researcher, broadcasting/film/video in the Last Year: Not on file  . Ran Out of Food in the Last Year: Not on file  Transportation Needs:   . Lack of Transportation (Medical): Not on file  . Lack of Transportation (Non-Medical): Not on file  Physical Activity:   . Days of Exercise per Week: Not on file  . Minutes of Exercise per Session: Not on file  Stress:   . Feeling of Stress : Not on file  Social Connections:   . Frequency of Communication with Friends and Family: Not on file  . Frequency of Social Gatherings with Friends and Family: Not on file  .  Attends Religious Services: Not on file  . Active Member of Clubs or Organizations: Not on file  . Attends Banker Meetings: Not on file  . Marital Status: Not on file     Allergies:  No Known Allergies  Metabolic Disorder Labs: Lab Results  Component Value Date   HGBA1C 5.2 01/14/2015   No results found for: PROLACTIN Lab Results  Component Value Date   CHOL 221 (H) 01/14/2019   TRIG 116.0 01/14/2019   HDL 30.50 (L) 01/14/2019   CHOLHDL 7 01/14/2019   VLDL 23.2 01/14/2019   LDLCALC 167 (H) 01/14/2019   Lab Results  Component Value Date   TSH 1.68 01/14/2015    Therapeutic Level Labs: No results found for: LITHIUM No results found for: CBMZ No  results found for: VALPROATE  Current Medications: Current Outpatient Medications  Medication Sig Dispense Refill  . atomoxetine (STRATTERA) 40 MG capsule Take 1 capsule (40 mg total) by mouth daily. Give generic 30 capsule 1  . Semaglutide,0.25 or 0.5MG /DOS, (OZEMPIC, 0.25 OR 0.5 MG/DOSE,) 2 MG/1.5ML SOPN Inject 0.5 mg into the skin once a week. 3 pen 2   No current facility-administered medications for this visit.      Psychiatric Specialty Exam: Review of Systems  Cardiovascular: Negative for chest pain.  Psychiatric/Behavioral: Negative for depression and suicidal ideas.    There were no vitals taken for this visit.There is no height or weight on file to calculate BMI.  General Appearance: Casual  Eye Contact:  Fair  Speech:  Normal Rate  Volume:  Decreased  Mood: fair  Affect:  Congruent  Thought Process:  Goal Directed  Orientation:  Full (Time, Place, and Person)  Thought Content:  Logical  Suicidal Thoughts:  No  Homicidal Thoughts:  No  Memory:  Immediate;   Fair Recent;   Fair  Judgement:  Fair  Insight:  Fair  Psychomotor Activity:  Normal  Concentration:  Concentration: Fair and Attention Span: Fair  Recall:  [employer] of Knowledge:Good  Language: Good  Akathisia:  No  Handed:  Right  AIMS (if indicated):  not done  Assets:  Desire for Improvement Physical Health  ADL's:  Intact  Cognition: WNL  Sleep:  Fair   Screenings: PHQ2-9     Office Visit from 01/14/2019 in [facility]  PHQ-2 Total Score 0      Assessment and Plan: as follows ADHD; stable on strattera, will continue   I discussed the assessment and treatment plan with the patient. The patient was provided an opportunity to ask questions and all were answered. The patient agreed with the plan and demonstrated an understanding of the instructions.  The patint was advised to call back or seek an in-person evaluation if the symptoms worsen or if the condition fails to  improve as anticipated.  Non face to face time spent or less  Fu 63m.   Thresa Ross, MD 11/12/20218:38 AM

## 2020-12-17 DIAGNOSIS — N200 Calculus of kidney: Secondary | ICD-10-CM | POA: Diagnosis not present

## 2020-12-17 DIAGNOSIS — D72829 Elevated white blood cell count, unspecified: Secondary | ICD-10-CM | POA: Diagnosis not present

## 2020-12-17 DIAGNOSIS — Z79899 Other long term (current) drug therapy: Secondary | ICD-10-CM | POA: Diagnosis not present

## 2020-12-17 DIAGNOSIS — R112 Nausea with vomiting, unspecified: Secondary | ICD-10-CM | POA: Diagnosis not present

## 2020-12-17 DIAGNOSIS — R519 Headache, unspecified: Secondary | ICD-10-CM | POA: Diagnosis not present

## 2020-12-17 DIAGNOSIS — N132 Hydronephrosis with renal and ureteral calculous obstruction: Secondary | ICD-10-CM | POA: Diagnosis not present

## 2020-12-17 DIAGNOSIS — I10 Essential (primary) hypertension: Secondary | ICD-10-CM | POA: Diagnosis not present

## 2020-12-17 DIAGNOSIS — E669 Obesity, unspecified: Secondary | ICD-10-CM | POA: Diagnosis not present

## 2020-12-19 ENCOUNTER — Encounter: Payer: Self-pay | Admitting: Family Medicine

## 2020-12-21 ENCOUNTER — Other Ambulatory Visit: Payer: Self-pay | Admitting: *Deleted

## 2020-12-21 DIAGNOSIS — N200 Calculus of kidney: Secondary | ICD-10-CM

## 2020-12-21 NOTE — Telephone Encounter (Signed)
Referral placed  Patient stated still unable to pass stone

## 2020-12-21 NOTE — Telephone Encounter (Signed)
Patient followed up regarding this and would like to speak to a nurse or dr.parker regarding this referral

## 2021-01-24 ENCOUNTER — Other Ambulatory Visit: Payer: Self-pay

## 2021-01-24 ENCOUNTER — Other Ambulatory Visit (HOSPITAL_COMMUNITY): Payer: Self-pay | Admitting: Psychiatry

## 2021-01-25 ENCOUNTER — Other Ambulatory Visit: Payer: Self-pay

## 2021-01-25 MED ORDER — ATOMOXETINE HCL 40 MG PO CAPS
40.0000 mg | ORAL_CAPSULE | Freq: Every day | ORAL | 0 refills | Status: DC
  Filled 2021-01-25 (×3): qty 30, 30d supply, fill #0

## 2021-01-26 ENCOUNTER — Ambulatory Visit (INDEPENDENT_AMBULATORY_CARE_PROVIDER_SITE_OTHER): Payer: 59 | Admitting: Urology

## 2021-01-26 ENCOUNTER — Encounter: Payer: Self-pay | Admitting: Urology

## 2021-01-26 ENCOUNTER — Other Ambulatory Visit: Payer: Self-pay

## 2021-01-26 VITALS — BP 130/84 | HR 67 | Ht 67.0 in | Wt 235.0 lb

## 2021-01-26 DIAGNOSIS — R310 Gross hematuria: Secondary | ICD-10-CM

## 2021-01-26 DIAGNOSIS — R109 Unspecified abdominal pain: Secondary | ICD-10-CM

## 2021-01-26 DIAGNOSIS — Z87442 Personal history of urinary calculi: Secondary | ICD-10-CM

## 2021-01-26 DIAGNOSIS — N200 Calculus of kidney: Secondary | ICD-10-CM | POA: Diagnosis not present

## 2021-01-26 NOTE — Progress Notes (Signed)
   01/26/2021 2:14 PM   Jason Wheeler 04/18/1996 [id number]  Referring provider: Ardith Dark, MD [id number] [place],  [place] [address]  Chief Complaint  Patient presents with  . Nephrolithiasis    HPI: Jason Wheeler is a 25 y.o. male referred for evaluation of nephrolithiasis.   Presented to ED [facility] 12/17/2020 complaining of right flank pain, nausea, vomiting similar to previous episodes of renal colic  He had an ultrasound performed in the ED which showed mild right hydronephrosis  Prior history of stone disease seen by [facility] urology  Was discharged on oral analgesics, antiemetics and tamsulosin  Contacted PCP early March 22 stating he had not passed a stone and still having pain  Intermittent episodes of flank pain, frequency and episode hematuria last weekend  Was evaluated at comprehensive stone clinic at [facility] 20/18/2019   PMH: Past Medical History:  Diagnosis Date  . ADHD (attention deficit hyperactivity disorder)   . Chest wall pain   . Renal disorder    kidney stones    Surgical History: Past Surgical History:  Procedure Laterality Date  . TONSILLECTOMY      Home Medications:  Allergies as of 01/26/2021   No Known Allergies     Medication List       Accurate as of January 26, 2021  2:14 PM. If you have any questions, ask your nurse or doctor.        STOP taking these medications   Ozempic (0.25 or 0.5 MG/DOSE) 2 MG/1.5ML Sopn Generic drug: Semaglutide(0.25 or 0.5MG /DOS) Stopped by: Riki Altes, MD     TAKE these medications   atomoxetine 40 MG capsule Commonly known as: STRATTERA Take 1 capsule (40 mg total) by mouth daily. Give generic       Allergies: No Known Allergies  Family History: Family History  Problem Relation Age of Onset  . Diabetes Mother   . Hypertension Mother   . Miscarriages / [place] Mother   . Hypertension Father   . Throat cancer Maternal Grandfather   . Heart disease Paternal  Grandmother   . Stroke Paternal Grandmother   . Prostate cancer Neg Hx   . Colon cancer Neg Hx     Social History:  reports that he has never smoked. He has never used smokeless tobacco. He reports current alcohol use. He reports that he does not use drugs.   Physical Exam: BP 130/84   Pulse 67   Ht 5\' 7"  (1.702 m)   Wt 235 lb (106.6 kg)   BMI 36.81 kg/m   Constitutional:  Alert and oriented, No acute distress. HEENT: [place] AT, moist mucus membranes.  Trachea midline, no masses. Cardiovascular: No clubbing, cyanosis, or edema. Respiratory: Normal respiratory effort, no increased work of breathing.  Laboratory Data:  Urinalysis Dipstick trace blood Microscopy negative   Assessment & Plan:    25 y.o. male with history of recurrent stone disease  Intermittent right flank pain and lower urinary tract symptoms since late February 2022 with POCT ultrasound in ED showing mild right hydronephrosis  Recommend scheduling stone protocol CT abdomen pelvis  Will call with results    March 2022, MD  [facility] [address], [address] [place], [place] [id number]

## 2021-01-27 LAB — URINALYSIS, COMPLETE
Bilirubin, UA: NEGATIVE
Glucose, UA: NEGATIVE
Ketones, UA: NEGATIVE
Leukocytes,UA: NEGATIVE
Nitrite, UA: NEGATIVE
Protein,UA: NEGATIVE
Specific Gravity, UA: 1.01 (ref 1.005–1.030)
Urobilinogen, Ur: 0.2 mg/dL (ref 0.2–1.0)
pH, UA: 6.5 (ref 5.0–7.5)

## 2021-01-27 LAB — MICROSCOPIC EXAMINATION: Bacteria, UA: NONE SEEN

## 2021-02-03 ENCOUNTER — Ambulatory Visit (HOSPITAL_COMMUNITY)
Admission: RE | Admit: 2021-02-03 | Discharge: 2021-02-03 | Disposition: A | Discharge status: Home / Self Care | Payer: 59 | Source: Ambulatory Visit | Attending: Urology | Admitting: Urology

## 2021-02-03 DIAGNOSIS — M4319 Spondylolisthesis, multiple sites in spine: Secondary | ICD-10-CM | POA: Diagnosis not present

## 2021-02-03 DIAGNOSIS — R109 Unspecified abdominal pain: Secondary | ICD-10-CM | POA: Diagnosis not present

## 2021-02-03 DIAGNOSIS — N201 Calculus of ureter: Secondary | ICD-10-CM | POA: Diagnosis not present

## 2021-02-05 ENCOUNTER — Encounter: Payer: Self-pay | Admitting: Urology

## 2021-02-14 ENCOUNTER — Other Ambulatory Visit: Payer: Self-pay | Admitting: Urology

## 2021-02-14 DIAGNOSIS — Z87442 Personal history of urinary calculi: Secondary | ICD-10-CM

## 2021-02-20 ENCOUNTER — Ambulatory Visit: Payer: 59

## 2021-02-27 ENCOUNTER — Other Ambulatory Visit: Payer: Self-pay

## 2021-02-28 ENCOUNTER — Other Ambulatory Visit: Payer: Self-pay

## 2021-03-02 ENCOUNTER — Encounter: Payer: Self-pay | Admitting: Emergency Medicine

## 2021-03-02 ENCOUNTER — Other Ambulatory Visit: Payer: Self-pay

## 2021-03-02 ENCOUNTER — Emergency Department: Payer: 59

## 2021-03-02 ENCOUNTER — Emergency Department
Admission: EM | Admit: 2021-03-02 | Discharge: 2021-03-02 | Disposition: A | Discharge status: Home / Self Care | Payer: 59 | Attending: Emergency Medicine | Admitting: Emergency Medicine

## 2021-03-02 ENCOUNTER — Telehealth: Payer: 59 | Admitting: Physician Assistant

## 2021-03-02 ENCOUNTER — Telehealth: Payer: Self-pay | Admitting: Urology

## 2021-03-02 DIAGNOSIS — R109 Unspecified abdominal pain: Secondary | ICD-10-CM | POA: Diagnosis not present

## 2021-03-02 DIAGNOSIS — N201 Calculus of ureter: Secondary | ICD-10-CM | POA: Diagnosis not present

## 2021-03-02 DIAGNOSIS — N200 Calculus of kidney: Secondary | ICD-10-CM

## 2021-03-02 DIAGNOSIS — Z87442 Personal history of urinary calculi: Secondary | ICD-10-CM | POA: Diagnosis not present

## 2021-03-02 DIAGNOSIS — R9431 Abnormal electrocardiogram [ECG] [EKG]: Secondary | ICD-10-CM | POA: Diagnosis not present

## 2021-03-02 DIAGNOSIS — K760 Fatty (change of) liver, not elsewhere classified: Secondary | ICD-10-CM | POA: Diagnosis not present

## 2021-03-02 DIAGNOSIS — N1330 Unspecified hydronephrosis: Secondary | ICD-10-CM | POA: Diagnosis not present

## 2021-03-02 DIAGNOSIS — M4317 Spondylolisthesis, lumbosacral region: Secondary | ICD-10-CM | POA: Diagnosis not present

## 2021-03-02 LAB — CBC
HCT: 41.2 % (ref 39.0–52.0)
Hemoglobin: 14.1 g/dL (ref 13.0–17.0)
MCH: 29.1 pg (ref 26.0–34.0)
MCHC: 34.2 g/dL (ref 30.0–36.0)
MCV: 84.9 fL (ref 80.0–100.0)
Platelets: 191 10*3/uL (ref 150–400)
RBC: 4.85 MIL/uL (ref 4.22–5.81)
RDW: 12.3 % (ref 11.5–15.5)
WBC: 12 10*3/uL — ABNORMAL HIGH (ref 4.0–10.5)
nRBC: 0 % (ref 0.0–0.2)

## 2021-03-02 LAB — URINALYSIS, COMPLETE (UACMP) WITH MICROSCOPIC
Bacteria, UA: NONE SEEN
Bilirubin Urine: NEGATIVE
Glucose, UA: NEGATIVE mg/dL
Ketones, ur: NEGATIVE mg/dL
Leukocytes,Ua: NEGATIVE
Nitrite: NEGATIVE
Protein, ur: NEGATIVE mg/dL
Specific Gravity, Urine: 1.032 — ABNORMAL HIGH (ref 1.005–1.030)
Squamous Epithelial / HPF: NONE SEEN (ref 0–5)
pH: 5 (ref 5.0–8.0)

## 2021-03-02 LAB — BASIC METABOLIC PANEL
Anion gap: 11 (ref 5–15)
BUN: 20 mg/dL (ref 6–20)
CO2: 24 mmol/L (ref 22–32)
Calcium: 9.3 mg/dL (ref 8.9–10.3)
Chloride: 101 mmol/L (ref 98–111)
Creatinine, Ser: 1.22 mg/dL (ref 0.61–1.24)
GFR, Estimated: >60 mL/min (ref >60)
Glucose, Bld: 107 mg/dL — ABNORMAL HIGH (ref 70–99)
Potassium: 4.1 mmol/L (ref 3.5–5.1)
Sodium: 136 mmol/L (ref 135–145)

## 2021-03-02 LAB — HEPATIC FUNCTION PANEL
ALT: 25 U/L (ref 0–44)
AST: 35 U/L (ref 15–41)
Albumin: 4.8 g/dL (ref 3.5–5.0)
Alkaline Phosphatase: 59 U/L (ref 38–126)
Bilirubin, Direct: 0.3 mg/dL — ABNORMAL HIGH (ref 0.0–0.2)
Indirect Bilirubin: 0.8 mg/dL (ref 0.3–0.9)
Total Bilirubin: 1.1 mg/dL (ref 0.3–1.2)
Total Protein: 7.9 g/dL (ref 6.5–8.1)

## 2021-03-02 MED ORDER — TAMSULOSIN HCL 0.4 MG PO CAPS
0.4000 mg | ORAL_CAPSULE | Freq: Once | ORAL | Status: AC
  Administered 2021-03-02: 0.4 mg via ORAL
  Filled 2021-03-02: qty 1

## 2021-03-02 MED ORDER — SODIUM CHLORIDE 0.9 % IV SOLN
1.5000 mg/kg | Freq: Once | INTRAVENOUS | Status: AC
  Administered 2021-03-02: 164 mg via INTRAVENOUS
  Filled 2021-03-02: qty 8.2

## 2021-03-02 MED ORDER — LACTATED RINGERS IV BOLUS
1000.0000 mL | Freq: Once | INTRAVENOUS | Status: AC
  Administered 2021-03-02: 1000 mL via INTRAVENOUS

## 2021-03-02 MED ORDER — KETOROLAC TROMETHAMINE 30 MG/ML IJ SOLN
15.0000 mg | Freq: Once | INTRAMUSCULAR | Status: AC
  Administered 2021-03-02: 15 mg via INTRAVENOUS
  Filled 2021-03-02: qty 1

## 2021-03-02 MED ORDER — MORPHINE SULFATE (PF) 4 MG/ML IV SOLN
6.0000 mg | Freq: Once | INTRAVENOUS | Status: AC
  Administered 2021-03-02: 6 mg via INTRAVENOUS
  Filled 2021-03-02: qty 2

## 2021-03-02 MED ORDER — OXYCODONE-ACETAMINOPHEN 5-325 MG PO TABS
1.0000 | ORAL_TABLET | ORAL | 0 refills | Status: DC | PRN
  Filled 2021-03-02: qty 12, 2d supply, fill #0

## 2021-03-02 NOTE — ED Triage Notes (Signed)
Pt comes into the ED via POV c/o right flank pain.  PT has chronic kidney stones and is supposed to have a scope completed next week.  PT is taking his home Rx for toradol and oxy with no relief.  Pt states his last void was last night.  Pt also c/o of right side groin pain.

## 2021-03-02 NOTE — ED Notes (Addendum)
Pt to ED for R flank pain, also in R lower groin and back pain. Pt has long history of R sided kidney stones (since 2016). Pt was scheduled for uroscopy on 5/24. Pt states pain got much worse last night at 2100, and describes pain as 7-8/10 constant and dull.  States has been drinking fluids and unable to void since last night. Bladder scan at bedside revealed 41mL urine.

## 2021-03-02 NOTE — ED Provider Notes (Signed)
-----------------------------------------   3:12 PM on 03/02/2021 -----------------------------------------  Blood pressure (!) 146/92, pulse 69, temperature 98.3 F (36.8 C), temperature source Oral, resp. rate 17, height 5\' 7"  (1.702 m), weight 108.9 kg, SpO2 97 %.  Assuming care from Dr. .  In short, Jason Wheeler is a 25 y.o. male with a chief complaint of Flank Pain .  Refer to the original H&P for additional details.  The current plan of care is to reassess following symptomatic treatment for uncomplicated ureterolithiasis.  ----------------------------------------- 7:07 PM on 03/02/2021 -----------------------------------------  Patient's pain is gradually improving following dose of IV lidocaine and Toradol.  No signs of infection noted and he is appropriate for discharge home with close urology follow-up.  We will prescribe additional pain medication and he was counseled to return to the ED for any new or worsening symptoms.  Patient agrees with plan.    05/02/2021, MD 03/02/21 1907

## 2021-03-02 NOTE — ED Notes (Signed)
Second nurse at bedside performing bladder scan.  Pt states has not voided since last night.  Bladder scan reveals 20 mL urine.  EDP at bedside.

## 2021-03-02 NOTE — ED Notes (Signed)
Pt expressing concern that plan of care seems unclear because he was previously told that he needed surgical intervention and now is being told that stone can be managed medically and pain management is priority. EDP now at bedside discussing plan of care with pt and mother and explaining that need for surgery is more related to whether there is infection associated or not.

## 2021-03-02 NOTE — Telephone Encounter (Signed)
Pt. Called asking if we could move his surgery up any sooner from 03/14/21. I told him the only way is if we get a cancellation on 03/07/21 because Dr. Lonna Cobb is in surgery until 5:00pm on 03/07/21. Pt. States he is in pain and the toradol and other pain medications are not relieving his pain. Please advise.

## 2021-03-02 NOTE — ED Notes (Signed)
Pt states feels like he is having kidneys stones, reports groin and flank pain since last night with urinary retention, was able to provide urine sample

## 2021-03-02 NOTE — ED Provider Notes (Signed)
[facility] Emergency Department Provider Note ____________________________________________   Event Date/Time   First MD Initiated Contact with Patient 03/02/21 1331     (approximate)  I have reviewed the triage vital signs and the nursing notes.  HISTORY  Chief Complaint Flank Pain   HPI Jason Wheeler is a 25 y.o. malewho presents to the ED for evaluation of right flank pain.  Chart review indicates history of nephrolithiasis for which he follows with Dr. Lonna Cobb.  Patient reports a long history of nephro and ureterolithiasis, with ureteroscopy scheduled on 5/24 with his urologist.  Patient ports developing right-sided flank pain, radiating to his right groin, last night unrelieved with his home Toradol and opiates.  Currently reporting 8/10 intensity pain to his right groin prior to medications.  Denies emesis.  Denies dysuria, stool changes or fevers.  Past Medical History:  Diagnosis Date  . ADHD (attention deficit hyperactivity disorder)   . Chest wall pain   . Renal disorder    kidney stones    Patient Active Problem List   Diagnosis Date Noted  . Obesity 05/25/2019  . Kidney stones 05/25/2019  . Dyslipidemia 01/15/2019  . ADHD 01/14/2019    Past Surgical History:  Procedure Laterality Date  . TONSILLECTOMY      Prior to Admission medications   Medication Sig Start Date End Date Taking? Authorizing Provider  atomoxetine (STRATTERA) 40 MG capsule Take 1 capsule (40 mg total) by mouth daily. Give generic Patient taking differently: Take 40 mg by mouth daily as needed (Anxiety). Give generic 01/25/21   Thresa Ross, MD  Potassium 99 MG TABS Take 99 mg by mouth daily.    [provider]  buPROPion (WELLBUTRIN SR) 100 MG 12 hr tablet Take 1 tablet (100 mg total) by mouth daily. 09/16/19 10/29/19  Thresa Ross, MD    Allergies Patient has no known allergies.  Family History  Problem Relation Age of Onset  . Diabetes  Mother   . Hypertension Mother   . Miscarriages / [place] Mother   . Hypertension Father   . Throat cancer Maternal Grandfather   . Heart disease Paternal Grandmother   . Stroke Paternal Grandmother   . Prostate cancer Neg Hx   . Colon cancer Neg Hx     Social History Social History   Tobacco Use  . Smoking status: Never Smoker  . Smokeless tobacco: Never Used  Substance Use Topics  . Alcohol use: Yes    Comment: 1 beer once a month  . Drug use: Never    Review of Systems  Constitutional: No fever/chills Eyes: No visual changes. ENT: No sore throat. Cardiovascular: Denies chest pain. Respiratory: Denies shortness of breath. Gastrointestinal:   No nausea, no vomiting.  No diarrhea.  No constipation. Positive for right flank pain Genitourinary: Positive for urinary hesitancy and poor output. Musculoskeletal: Negative for back pain. Skin: Negative for rash. Neurological: Negative for headaches, focal weakness or numbness.  ____________________________________________   PHYSICAL EXAM:  VITAL SIGNS: Vitals:   03/02/21 1250  BP: (!) 146/92  Pulse: 69  Resp: 17  Temp: 98.3 F (36.8 C)  SpO2: 97%    Constitutional: Alert and oriented.  Appears uncomfortable but in no distress. Eyes: Conjunctivae are normal. PERRL. EOMI. Head: Atraumatic. Nose: No congestion/rhinnorhea. Mouth/Throat: Mucous membranes are moist.  Oropharynx non-erythematous. Neck: No stridor. No cervical spine tenderness to palpation. Cardiovascular: Normal rate, regular rhythm. Grossly normal heart sounds.  Good peripheral circulation. Respiratory: Normal respiratory effort.  No retractions.  Lungs CTAB. Gastrointestinal: Soft , nondistended. Poorly localizing right flank and right-sided abdominal tenderness without peritoneal features.  Right-sided CVA tenderness is present.  Left-sided abdomen is benign. Chaperoned GU exam without evidence of scrotal or testicular tenderness or  swelling. Musculoskeletal: No lower extremity tenderness nor edema.  No joint effusions. No signs of acute trauma. Neurologic:  Normal speech and language. No gross focal neurologic deficits are appreciated. No gait instability noted. Skin:  Skin is warm, dry and intact. No rash noted. Psychiatric: Mood and affect are normal. Speech and behavior are normal. ____________________________________________   LABS (all labs ordered are listed, but only abnormal results are displayed)  Labs Reviewed  URINALYSIS, COMPLETE (UACMP) WITH MICROSCOPIC - Abnormal; Notable for the following components:      Result Value   Color, Urine YELLOW (*)    APPearance CLEAR (*)    Specific Gravity, Urine 1.032 (*)    Hgb urine dipstick MODERATE (*)    All other components within normal limits  BASIC METABOLIC PANEL - Abnormal; Notable for the following components:   Glucose, Bld 107 (*)    All other components within normal limits  CBC - Abnormal; Notable for the following components:   WBC 12.0 (*)    All other components within normal limits  HEPATIC FUNCTION PANEL - Abnormal; Notable for the following components:   Bilirubin, Direct 0.3 (*)    All other components within normal limits   ____________________________________________  12 Lead EKG  Sinus rhythm, rate of 67 bpm.  Normal axis and intervals.  No evidence of acute ischemia. ____________________________________________  RADIOLOGY  ED MD interpretation: CT renal study reviewed by me with 4 mm distal right-sided stone  Official radiology report(s): CT Renal Stone Study  Result Date: 03/02/2021 CLINICAL DATA:  Right flank pain.  History of kidney stones. EXAM: CT ABDOMEN AND PELVIS WITHOUT CONTRAST TECHNIQUE: Multidetector CT imaging of the abdomen and pelvis was performed following the standard protocol without IV contrast. COMPARISON:  CT abdomen pelvis dated February 03, 2021. FINDINGS: Lower chest: No acute abnormality. Hepatobiliary: Mild  diffusely decreased liver density. No focal abnormality. The gallbladder is unremarkable. No biliary dilatation. Pancreas: Unremarkable. No pancreatic ductal dilatation or surrounding inflammatory changes. Spleen: Normal in size without focal abnormality. Adrenals/Urinary Tract: The adrenal glands and left kidney are unremarkable. Previously described 4 mm calculus in the distal right ureter is now at the UVJ. There is new mild right hydroureteronephrosis and perinephric fat stranding. The bladder is unremarkable. Stomach/Bowel: Stomach is within normal limits. Appendix appears normal. No evidence of bowel wall thickening, distention, or inflammatory changes. Vascular/Lymphatic: No significant vascular findings are present. No enlarged abdominal or pelvic lymph nodes. Reproductive: Prostate is unremarkable. Other: No abdominal wall hernia or abnormality. No abdominopelvic ascites. No pneumoperitoneum. Musculoskeletal: No acute or significant osseous findings. 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 IMPRESSION: 1. Slight inferior migration of the previously described 4 mm calculus in the distal right ureter, now at the UVJ. New mild right hydroureteronephrosis. 2. Mild hepatic steatosis. Electronically Signed   By: Obie Dredge M.D.   On: 03/02/2021 14:59    ____________________________________________   PROCEDURES and INTERVENTIONS  Procedure(s) performed (including Critical Care):  .1-3 Lead EKG Interpretation Performed by: Delton Prairie, MD Authorized by: Delton Prairie, MD     Interpretation: normal     ECG rate:  70   ECG rate assessment: normal     Rhythm: sinus rhythm     Ectopy: none  Conduction: normal      Medications  lidocaine (XYLOCAINE) 164 mg in sodium chloride 0.9 % 100 mL IVPB (has no administration in time range)  lactated ringers bolus 1,000 mL (1,000 mLs Intravenous New Bag/Given 03/02/21 1415)  morphine 4 MG/ML injection 6 mg (6  mg Intravenous Given 03/02/21 1411)    ____________________________________________   MDM / ED COURSE   25 year old male with long history of nephrolithiasis presents to the ED with evidence of uncomplicated ureterolithiasis.  Normal vitals.  Exam with poorly localizing right-sided tenderness without peritoneal features.  Blood work with intact renal function.  Urinalysis without infectious features.  EKG nonischemic with normal intervals.  CT demonstrates distal ureteral stone.  Considering how distal the stone is and moderate size, hopefully he will be able to pass a stone while in the ED.  Will provide additional fluid resuscitation, tamsulosin and IV lidocaine.  Patient signed out to oncoming provider to follow-up on his clinical passage of the stone.  Clinical Course as of 03/02/21 1516  Thu Mar 02, 2021  1512 Reassessed.  Patient reports improving symptoms.  We discussed his distal ureteral stone and hopeful passage of the stone in the ED.  Patient signed out to oncoming provider to follow-up on his clinical reevaluation after additional medications and fluid resuscitation [DS]    Clinical Course User Index [DS] Delton Prairie, MD    ____________________________________________   FINAL CLINICAL IMPRESSION(S) / ED DIAGNOSES  Final diagnoses:  Ureterolithiasis  Right flank pain     ED Discharge Orders    None       Scarleth Brame Katrinka Blazing   Note:  This document was prepared using Dragon voice recognition software and may include unintentional dictation errors.   Delton Prairie, MD 03/02/21 [phone]

## 2021-03-02 NOTE — ED Notes (Signed)
Pt to CT

## 2021-03-02 NOTE — Progress Notes (Signed)
Based on what you shared with me, I feel your condition warrants further evaluation and I recommend that you be seen in a face to face office visit. If you are having severe pain that is unrelenting and difficulty with urination secondary to kidney stone, you need to contact your Urologist ASAP or be evaluated at the nearest ER. This is not something we can adequately treat via evisit or video on demand visit.    NOTE: If you entered your credit card information for this eVisit, you will not be charged. You may see a "hold" on your card for the $35 but that hold will drop off and you will not have a charge processed.   If you are having a true medical emergency please call 911.      For an urgent face to face visit, [place] has six urgent care centers for your convenience:     [facility] at [facility] Directions [phone] [address] [place], [place] [address] . 8 am - 4 pm Monday - Friday    [facility]) Get Driving Directions [phone] [address] [place], [place] [address] . 8 am to 8 pm Monday-Friday . 10 am to 6 pm [facility]) Get Driving Directions [phone]  [address] [place],  [place]  [address] . 8 am to 8 pm Monday-Friday . 8 am to 4 pm [facility] Urgent Care at [facility] Get Driving Directions [phone] [address] [place], [place] [address] . 8 am to 8 pm Monday-Friday . 8 am to 4 pm [facility] Urgent Care at [facility] Get Driving Directions  [phone] [address] [place], [place] [address] . 8 am to 8 pm Monday-Friday . 8 am to 4 pm [facility] Urgent Care at [facility] Directions [phone] [address], [address] [place], [place] [address] . 8 am to 8 pm  Monday-Friday . 8 am to 4 pm Saturday-Sunday     Your MyChart E-visit questionnaire answers were reviewed by a board certified advanced clinical practitioner to complete your personal care plan based on your specific symptoms.  Thank you for using e-Visits.

## 2021-03-03 ENCOUNTER — Other Ambulatory Visit: Payer: Self-pay

## 2021-03-03 DIAGNOSIS — R310 Gross hematuria: Secondary | ICD-10-CM

## 2021-03-03 DIAGNOSIS — Z87442 Personal history of urinary calculi: Secondary | ICD-10-CM

## 2021-03-06 ENCOUNTER — Other Ambulatory Visit: Payer: Self-pay

## 2021-03-06 ENCOUNTER — Other Ambulatory Visit: Payer: 59

## 2021-03-06 DIAGNOSIS — Z87442 Personal history of urinary calculi: Secondary | ICD-10-CM | POA: Diagnosis not present

## 2021-03-06 DIAGNOSIS — R310 Gross hematuria: Secondary | ICD-10-CM

## 2021-03-09 ENCOUNTER — Encounter
Admission: RE | Admit: 2021-03-09 | Discharge: 2021-03-09 | Disposition: A | Discharge status: Home / Self Care | Payer: 59 | Source: Ambulatory Visit | Attending: Urology | Admitting: Urology

## 2021-03-09 ENCOUNTER — Other Ambulatory Visit: Payer: Self-pay

## 2021-03-09 HISTORY — DX: Anxiety disorder, unspecified: F41.9

## 2021-03-09 HISTORY — DX: Personal history of urinary calculi: Z87.442

## 2021-03-09 NOTE — Patient Instructions (Signed)
INSTRUCTIONS FOR SURGERY     Your surgery is scheduled for:  Tuesday, MAY 24TH     To find out your arrival time for the day of surgery,          please call [phone] between 1 pm and 3 pm on : Monday, MAY 23RD     When you arrive for surgery, report to the REGISTRATION DESK ON THE FIRST FLOOR OF THE MEDICAL MALL. ONCE THEY HAVE COMPLETED THEIR PROCESS, PROCEED TO THE SECOND FLOOR AND SIGN IN AT THE SURGERY DESK.    REMEMBER: Instructions that are not followed completely may result in serious medical risk,  up to and including death, or upon the discretion of your surgeon and anesthesiologist,            your surgery may need to be rescheduled.  __X__ 1. Do not eat food after midnight the night before your procedure.                    No gum, candy, lozenger, tic tacs, tums or hard candies.                  ABSOLUTELY NOTHING SOLID IN YOUR MOUTH AFTER MIDNIGHT                    You may drink unlimited clear liquids up to 2 hours before you are scheduled to arrive for surgery.                   Do not drink anything within those 2 hours unless you need to take medicine, then take the                   smallest amount you need.  Clear liquids include:  water, apple juice without pulp,                   any flavor Gatorade, Black coffee, black tea.  Sugar may be added but no dairy/ honey /lemon.                        Broth and jello is not considered a clear liquid.  __x__  2. On the morning of surgery, please brush your teeth with toothpaste and water. You may rinse with                  mouthwash if you wish but DO NOT SWALLOW TOOTHPASTE OR MOUTHWASH  __X___3. NO alcohol for 24 hours before or after surgery.  __x___ 4.  Do NOT smoke or use e-cigarettes for 24 HOURS PRIOR TO SURGERY.                      DO NOT Use any chewable tobacco products for at least 6 hours prior to surgery.  __x___ 5. If you start any new  medication after this appointment and prior to surgery, please                   Bring it with you on the day of surgery.  ___x__ 6. Notify your doctor if  there is any change in your medical condition, such as fever,                   infection, vomitting, diarrhea or any open sores.  __x___ 7.  USE ANTIBACTERIAL SOAP as instructed, the night before surgery and the day of surgery.                   Once you have washed with this soap, do NOT use any of the following: Powders, perfumes                    or lotions. Please do not wear make up, hairpins, clips or nail polish. You MAY wear deodorant.                                                               Men may shave their face and neck.                     DO NOT wear ANY jewelry on the day of surgery. If there are rings that are too tight to                    remove easily, please address this prior to the surgery day. Piercings need to be removed.                                                                     NO METAL ON YOUR BODY.                    Do NOT bring any valuables.  If you came to Pre-Admit testing then you will not need license,                     insurance card or credit card.  If you will be staying overnight, please either leave your things in                     the car or have your family be responsible for these items.                     [place] IS NOT RESPONSIBLE FOR BELONGINGS OR VALUABLES.  ___X__ 8. DO NOT wear contact lenses on surgery day.  You may not have dentures,                     Hearing aides, contacts or glasses in the operating room. These items can be                    Placed in the Recovery Room to receive immediately after surgery.  __x___ 9. IF YOU ARE SCHEDULED TO GO HOME ON THE SAME DAY, YOU MUST                   Have someone to drive you home and to stay with you  for  the first 24 hours.                    Have an arrangement prior to arriving on surgery day.  ___x__ 10. Take  the following medications on the morning of surgery with a sip of water:                              1.PERCOCET or TYLENOL, if needed                     2.                     3.  __X__  12. STOP ALL ASPIRIN PRODUCTS AS OF TODAY, MAY 19TH                       THIS INCLUDES BC POWDERS / GOODIES POWDER  __x___ 13. STOP Anti-inflammatories as of TODAY, MAY 19TH                      This includes IBUPROFEN / MOTRIN / ADVIL / ALEVE/ NAPROXYN                    YOU MAY TAKE TYLENOL ANY TIME PRIOR TO SURGERY.  __X___ 14.  Stop supplements until after surgery.                     This includes: POTASSIUM  ___X___17.  Continue to take the following medications but do not take on the morning of surgery:                     STRATERRA  _X_____18.  Wear clean and comfortable clothing to the hospital.  PLEASE BRING PHONE NUMBERS FOR YOUR CONTACTS.  HAVE PLENTY OF FLUIDS TO CONSUME AFTER YOUR SURGERY.  GOOD LUCK AND FEEL BETTER!!

## 2021-03-10 LAB — URINALYSIS, COMPLETE
Bilirubin, UA: NEGATIVE
Glucose, UA: NEGATIVE
Ketones, UA: NEGATIVE
Leukocytes,UA: NEGATIVE
Nitrite, UA: NEGATIVE
Protein,UA: NEGATIVE
Specific Gravity, UA: 1.025 (ref 1.005–1.030)
Urobilinogen, Ur: 0.2 mg/dL (ref 0.2–1.0)
pH, UA: 5.5 (ref 5.0–7.5)

## 2021-03-10 LAB — CULTURE, URINE COMPREHENSIVE

## 2021-03-10 LAB — MICROSCOPIC EXAMINATION

## 2021-03-14 ENCOUNTER — Ambulatory Visit: Payer: 59

## 2021-03-14 ENCOUNTER — Other Ambulatory Visit: Payer: Self-pay

## 2021-03-14 ENCOUNTER — Encounter
Admission: RE | Disposition: A | Discharge status: Home / Self Care | Payer: Self-pay | Source: Ambulatory Visit | Attending: Urology

## 2021-03-14 ENCOUNTER — Encounter: Payer: Self-pay | Admitting: Urology

## 2021-03-14 ENCOUNTER — Ambulatory Visit: Payer: 59 | Admitting: Certified Registered"

## 2021-03-14 ENCOUNTER — Ambulatory Visit
Admission: RE | Admit: 2021-03-14 | Discharge: 2021-03-14 | Disposition: A | Discharge status: Home / Self Care | Payer: 59 | Source: Ambulatory Visit | Attending: Urology | Admitting: Urology

## 2021-03-14 DIAGNOSIS — N200 Calculus of kidney: Secondary | ICD-10-CM | POA: Diagnosis not present

## 2021-03-14 DIAGNOSIS — N132 Hydronephrosis with renal and ureteral calculous obstruction: Secondary | ICD-10-CM | POA: Diagnosis present

## 2021-03-14 DIAGNOSIS — N201 Calculus of ureter: Secondary | ICD-10-CM | POA: Insufficient documentation

## 2021-03-14 DIAGNOSIS — Z79899 Other long term (current) drug therapy: Secondary | ICD-10-CM | POA: Diagnosis not present

## 2021-03-14 DIAGNOSIS — N23 Unspecified renal colic: Secondary | ICD-10-CM | POA: Diagnosis not present

## 2021-03-14 DIAGNOSIS — Z87442 Personal history of urinary calculi: Secondary | ICD-10-CM

## 2021-03-14 HISTORY — PX: CYSTOSCOPY/URETEROSCOPY/HOLMIUM LASER/STENT PLACEMENT: SHX6546

## 2021-03-14 SURGERY — CYSTOSCOPY/URETEROSCOPY/HOLMIUM LASER/STENT PLACEMENT
Anesthesia: General | Laterality: Right

## 2021-03-14 MED ORDER — TAMSULOSIN HCL 0.4 MG PO CAPS
0.4000 mg | ORAL_CAPSULE | Freq: Every day | ORAL | 0 refills | Status: DC
  Filled 2021-03-14: qty 14, 14d supply, fill #0

## 2021-03-14 MED ORDER — OXYBUTYNIN CHLORIDE 5 MG PO TABS
ORAL_TABLET | ORAL | 0 refills | Status: DC
  Filled 2021-03-14: qty 15, 5d supply, fill #0

## 2021-03-14 MED ORDER — MIDAZOLAM HCL 2 MG/2ML IJ SOLN
INTRAMUSCULAR | Status: DC | PRN
  Administered 2021-03-14: 2 mg via INTRAVENOUS

## 2021-03-14 MED ORDER — MIDAZOLAM HCL 2 MG/2ML IJ SOLN
INTRAMUSCULAR | Status: AC
  Filled 2021-03-14: qty 2

## 2021-03-14 MED ORDER — FENTANYL CITRATE (PF) 100 MCG/2ML IJ SOLN
25.0000 ug | INTRAMUSCULAR | Status: DC | PRN
Start: 2021-03-14 — End: 2021-03-14

## 2021-03-14 MED ORDER — ONDANSETRON HCL 4 MG/2ML IJ SOLN: 4.0000 mg | Freq: Once | INTRAMUSCULAR | Status: DC | PRN

## 2021-03-14 MED ORDER — IOPAMIDOL (ISOVUE-200) INJECTION 41%
INTRAVENOUS | Status: DC | PRN
  Administered 2021-03-14: 10 mL via INTRAVENOUS

## 2021-03-14 MED ORDER — CHLORHEXIDINE GLUCONATE 0.12 % MT SOLN
OROMUCOSAL | Status: AC
  Administered 2021-03-14: 15 mL via OROMUCOSAL
  Filled 2021-03-14: qty 15

## 2021-03-14 MED ORDER — SUCCINYLCHOLINE CHLORIDE 200 MG/10ML IV SOSY
PREFILLED_SYRINGE | INTRAVENOUS | Status: AC
  Filled 2021-03-14: qty 10

## 2021-03-14 MED ORDER — OXYCODONE-ACETAMINOPHEN 5-325 MG PO TABS
1.0000 | ORAL_TABLET | Freq: Four times a day (QID) | ORAL | 0 refills | Status: DC | PRN
  Filled 2021-03-14: qty 10, 3d supply, fill #0

## 2021-03-14 MED ORDER — FENTANYL CITRATE (PF) 100 MCG/2ML IJ SOLN
INTRAMUSCULAR | Status: AC
  Filled 2021-03-14: qty 2

## 2021-03-14 MED ORDER — SUCCINYLCHOLINE CHLORIDE 20 MG/ML IJ SOLN
INTRAMUSCULAR | Status: DC | PRN
  Administered 2021-03-14: 120 mg via INTRAVENOUS

## 2021-03-14 MED ORDER — FENTANYL CITRATE (PF) 100 MCG/2ML IJ SOLN
INTRAMUSCULAR | Status: DC | PRN
  Administered 2021-03-14 (×2): 50 ug via INTRAVENOUS

## 2021-03-14 MED ORDER — LIDOCAINE HCL (CARDIAC) PF 100 MG/5ML IV SOSY
PREFILLED_SYRINGE | INTRAVENOUS | Status: DC | PRN
  Administered 2021-03-14: 100 mg via INTRAVENOUS

## 2021-03-14 MED ORDER — ORAL CARE MOUTH RINSE: 15.0000 mL | Freq: Once | OROMUCOSAL | Status: AC

## 2021-03-14 MED ORDER — LACTATED RINGERS IV SOLN: INTRAVENOUS | Status: DC | PRN

## 2021-03-14 MED ORDER — DEXAMETHASONE SODIUM PHOSPHATE 10 MG/ML IJ SOLN
INTRAMUSCULAR | Status: DC | PRN
  Administered 2021-03-14: 5 mg via INTRAVENOUS

## 2021-03-14 MED ORDER — LACTATED RINGERS IV SOLN: INTRAVENOUS | Status: DC

## 2021-03-14 MED ORDER — CEFAZOLIN SODIUM-DEXTROSE 2-4 GM/100ML-% IV SOLN
INTRAVENOUS | Status: AC
  Filled 2021-03-14: qty 100

## 2021-03-14 MED ORDER — ONDANSETRON HCL 4 MG/2ML IJ SOLN
INTRAMUSCULAR | Status: DC | PRN
  Administered 2021-03-14: 4 mg via INTRAVENOUS

## 2021-03-14 MED ORDER — FAMOTIDINE 20 MG PO TABS: 20.0000 mg | ORAL_TABLET | Freq: Once | ORAL | Status: AC

## 2021-03-14 MED ORDER — PROPOFOL 10 MG/ML IV BOLUS
INTRAVENOUS | Status: DC | PRN
  Administered 2021-03-14: 200 mg via INTRAVENOUS

## 2021-03-14 MED ORDER — CEFAZOLIN SODIUM-DEXTROSE 2-4 GM/100ML-% IV SOLN
2.0000 g | INTRAVENOUS | Status: AC
  Administered 2021-03-14: 2 g via INTRAVENOUS

## 2021-03-14 MED ORDER — ROCURONIUM BROMIDE 10 MG/ML (PF) SYRINGE
PREFILLED_SYRINGE | INTRAVENOUS | Status: AC
  Filled 2021-03-14: qty 10

## 2021-03-14 MED ORDER — CHLORHEXIDINE GLUCONATE 0.12 % MT SOLN: 15.0000 mL | Freq: Once | OROMUCOSAL | Status: AC

## 2021-03-14 MED ORDER — PROPOFOL 10 MG/ML IV BOLUS
INTRAVENOUS | Status: AC
  Filled 2021-03-14: qty 40

## 2021-03-14 MED ORDER — SUGAMMADEX SODIUM 200 MG/2ML IV SOLN
INTRAVENOUS | Status: DC | PRN
  Administered 2021-03-14: 200 mg via INTRAVENOUS

## 2021-03-14 MED ORDER — FAMOTIDINE 20 MG PO TABS
ORAL_TABLET | ORAL | Status: AC
  Administered 2021-03-14: 20 mg via ORAL
  Filled 2021-03-14: qty 1

## 2021-03-14 MED ORDER — ROCURONIUM BROMIDE 100 MG/10ML IV SOLN
INTRAVENOUS | Status: DC | PRN
  Administered 2021-03-14: 5 mg via INTRAVENOUS

## 2021-03-14 SURGICAL SUPPLY — 30 items
BAG DRAIN CYSTO-URO LG1000N (MISCELLANEOUS) ×2 IMPLANT
BASKET ZERO TIP 1.9FR (BASKET) ×2 IMPLANT
BRUSH SCRUB EZ 1% IODOPHOR (MISCELLANEOUS) ×2 IMPLANT
BSKT STON RTRVL ZERO TP 1.9FR (BASKET) ×1
CATH URET FLEX-TIP 2 LUMEN 10F (CATHETERS) IMPLANT
CATH URETL 5X70 OPEN END (CATHETERS) IMPLANT
CNTNR SPEC 2.5X3XGRAD LEK (MISCELLANEOUS)
CONT SPEC 4OZ STER OR WHT (MISCELLANEOUS)
CONT SPEC 4OZ STRL OR WHT (MISCELLANEOUS)
CONTAINER SPEC 2.5X3XGRAD LEK (MISCELLANEOUS) IMPLANT
DRAPE UTILITY 15X26 TOWEL STRL (DRAPES) ×2 IMPLANT
GLOVE SURG UNDER POLY LF SZ7.5 (GLOVE) ×2 IMPLANT
GOWN STRL REUS W/ TWL LRG LVL3 (GOWN DISPOSABLE) ×1 IMPLANT
GOWN STRL REUS W/ TWL XL LVL3 (GOWN DISPOSABLE) ×1 IMPLANT
GOWN STRL REUS W/TWL LRG LVL3 (GOWN DISPOSABLE) ×2
GOWN STRL REUS W/TWL XL LVL3 (GOWN DISPOSABLE) ×2
GUIDEWIRE STR DUAL SENSOR (WIRE) ×2 IMPLANT
INFUSOR MANOMETER BAG 3000ML (MISCELLANEOUS) ×2 IMPLANT
IV NS IRRIG 3000ML ARTHROMATIC (IV SOLUTION) ×2 IMPLANT
KIT TURNOVER CYSTO (KITS) ×2 IMPLANT
PACK CYSTO AR (MISCELLANEOUS) ×2 IMPLANT
SET CYSTO W/LG BORE CLAMP LF (SET/KITS/TRAYS/PACK) ×2 IMPLANT
SHEATH URETERAL 12FRX35CM (MISCELLANEOUS) IMPLANT
STENT URET 6FRX24 CONTOUR (STENTS) IMPLANT
STENT URET 6FRX26 CONTOUR (STENTS) IMPLANT
SURGILUBE 2OZ TUBE FLIPTOP (MISCELLANEOUS) ×2 IMPLANT
TRACTIP FLEXIVA PULS ID 200XHI (Laser) ×1 IMPLANT
TRACTIP FLEXIVA PULSE ID 200 (Laser) ×4 IMPLANT
VALVE UROSEAL ADJ ENDO (VALVE) IMPLANT
WATER STERILE IRR 1000ML POUR (IV SOLUTION) ×2 IMPLANT

## 2021-03-14 NOTE — Anesthesia Postprocedure Evaluation (Signed)
Anesthesia Post Note  Patient: Jason Wheeler  Procedure(s) Performed: CYSTOSCOPY/URETEROSCOPY/HOLMIUM LASER/ POSSIBLE STENT PLACEMENT (Right )  Patient location during evaluation: PACU Anesthesia Type: General Level of consciousness: awake and alert and oriented Pain management: pain level controlled Vital Signs Assessment: post-procedure vital signs reviewed and stable Respiratory status: spontaneous breathing Cardiovascular status: blood pressure returned to baseline Anesthetic complications: no   No complications documented.   Last Vitals:  Vitals:   03/14/21 1046 03/14/21 1110  BP: 125/75   Pulse:  (P) 60  Resp: 16 (P) 16  Temp: (!) 36.1 C   SpO2: 100% (P) 100%    Last Pain:  Vitals:   03/14/21 1046  TempSrc: Temporal  PainSc: 2                  Argelio Granier

## 2021-03-14 NOTE — Op Note (Signed)
Preoperative diagnosis: Right distal ureteral calculus  Postoperative diagnosis: Right distal ureteral calculus  Procedure:  1. Cystoscopy 2. Right ureteroscopy and stone removal 3. Ureteroscopic laser lithotripsy 4. Right ureteral stent placement (6FR/24 cm) 5. Right retrograde pyelography with interpretation  Surgeon: Lorin Picket C. Arianah Torgeson, M.D.  Anesthesia: General  Complications: None  Intraoperative findings:  1.  Cystoscopy-urethra normal in caliber no stricture; prostate nonocclusive; inflammatory changes right ureteral orifice with distal portion of calculus crowning; no other bladder mucosal lesions, solid or papillary tumors 2.  Right retrograde retrograde pyelography post procedure showed no filling defects, stone fragments or contrast extravasation  EBL: Minimal  Specimens: 1. Calculus fragments for analysis   Indication: Jason Wheeler is a 25 y.o. with intermittent renal colic since late February 2022 and CT showing a right distal ureteral calculus. After reviewing the management options for treatment, the patient elected to proceed with the above surgical procedure(s). We have discussed the potential benefits and risks of the procedure, side effects of the proposed treatment, the likelihood of the patient achieving the goals of the procedure, and any potential problems that might occur during the procedure or recuperation. Informed consent has been obtained.  Description of procedure:  The patient was taken to the operating room and general anesthesia was induced.  The patient was placed in the dorsal lithotomy position, prepped and draped in the usual sterile fashion, and preoperative antibiotics were administered. A preoperative time-out was performed.   A 21 French cystoscope was lubricated, passed per urethra and advanced proximally under direct vision with findings as described above.    Attention was directed to the right ureteral orifice and a 0.038 Sensor wire  was then advanced up the ureter into the renal pelvis under fluoroscopic guidance.  A 4.5 Fr semirigid ureteroscope was then advanced into the ureter next to the guidewire and the calculus was identified.  The stone was then partially dusted with a 242 micron holmium laser fiber on a setting of 0.2 J and frequency of 20 hz.   Once reduced in size via dusting the remainder the calculus was removed with a 1.9 [place] nitinol basket.  Reinspection of the ureter revealed no remaining visible stones or fragments.   Retrograde pyelogram was performed through the ureteroscope with findings as described above.  A 6 FR/24 CM Contour ureteral stent was placed under fluoroscopic guidance.  The wire was then removed with the proximal portion of the stent partially curled in a distal calyx and adequate stent curl noted in the bladder.  The bladder was then emptied and the procedure ended.  The patient appeared to tolerate the procedure well and without complications.  After anesthetic reversal the patient was transported to the PACU in stable condition.    Plan:  The stent was left attached to a tether and the patient was instructed to remove on Friday, 03/17/2021  Office follow-up 1 month for review of stone analysis and discussion of metabolic stone evaluation   Irineo Axon, MD

## 2021-03-14 NOTE — Interval H&P Note (Signed)
History and Physical Interval Note:  03/14/2021 8:56 AM  Jason Wheeler  has presented today for surgery, with the diagnosis of right distal ureteral calculus.  The various methods of treatment have been discussed with the patient and family. After consideration of risks, benefits and other options for treatment, the patient has consented to  Procedure(s): CYSTOSCOPY/URETEROSCOPY/HOLMIUM LASER/ POSSIBLE STENT PLACEMENT (Right) as a surgical intervention.  The patient's history has been reviewed, patient examined, no change in status, stable for surgery.  I have reviewed the patient's chart and labs.  Questions were answered to the patient's satisfaction.     Jason Wheeler

## 2021-03-14 NOTE — Anesthesia Preprocedure Evaluation (Signed)
Anesthesia Evaluation  Patient identified by MRN, date of birth, ID band Patient awake    Reviewed: Allergy & Precautions, NPO status , Patient's Chart, lab work & pertinent test results  Airway Mallampati: II  TM Distance: >3 FB     Dental  (+) Teeth Intact   Pulmonary neg pulmonary ROS,    Pulmonary exam normal        Cardiovascular negative cardio ROS Normal cardiovascular exam     Neuro/Psych PSYCHIATRIC DISORDERS Anxiety negative neurological ROS     GI/Hepatic negative GI ROS, Neg liver ROS,   Endo/Other  negative endocrine ROS  Renal/GU stones     Musculoskeletal negative musculoskeletal ROS (+)   Abdominal Normal abdominal exam  (+)   Peds negative pediatric ROS (+)  Hematology negative hematology ROS (+)   Anesthesia Other Findings Past Medical History: No date: ADHD (attention deficit hyperactivity disorder) No date: Anxiety No date: Chest wall pain No date: History of kidney stones No date: Renal disorder     Comment:  kidney stones  Reproductive/Obstetrics                             Anesthesia Physical Anesthesia Plan  ASA: II  Anesthesia Plan: General   Post-op Pain Management:    Induction: Intravenous  PONV Risk Score and Plan:   Airway Management Planned: Oral ETT  Additional Equipment:   Intra-op Plan:   Post-operative Plan: Extubation in OR  Informed Consent: I have reviewed the patients History and Physical, chart, labs and discussed the procedure including the risks, benefits and alternatives for the proposed anesthesia with the patient or authorized representative who has indicated his/her understanding and acceptance.     Dental advisory given  Plan Discussed with: CRNA  Anesthesia Plan Comments:         Anesthesia Quick Evaluation

## 2021-03-14 NOTE — Transfer of Care (Signed)
Immediate Anesthesia Transfer of Care Note  Patient: Jason Wheeler  Procedure(s) Performed: CYSTOSCOPY/URETEROSCOPY/HOLMIUM LASER/ POSSIBLE STENT PLACEMENT (Right )  Patient Location: PACU  Anesthesia Type:General  Level of Consciousness: awake, alert  and oriented  Airway & Oxygen Therapy: Patient Spontanous Breathing  Post-op Assessment: Report given to RN and Post -op Vital signs reviewed and stable  Post vital signs: Reviewed and stable  Last Vitals:  Vitals Value Taken Time  BP    Temp    Pulse    Resp    SpO2      Last Pain:  Vitals:   03/14/21 0811  TempSrc: Temporal  PainSc: 3          Complications: No complications documented.

## 2021-03-14 NOTE — H&P (Signed)
Urology H&P  History of Present Illness: Jason Wheeler is a 26 y.o. y.o. male initially seen by me on 01/26/2021.   Was seen at ED [facility] 12/17/2020 complaining of right flank pain, nausea and vomiting similar to prior episodes of renal colic  Point-of-care ultrasound showed mild right hydronephrosis  Continued with intermittent pain and was referred to urology  Stone protocol CT was obtained 02/03/2021 which showed a 4 mm calculus near the right UVJ  ED visit 03/02/2021 for recurrent flank pain and CT was repeated which showed continued presence of the stone  After discussing options he has elected ureteroscopic removal  Past Medical History:  Diagnosis Date  . ADHD (attention deficit hyperactivity disorder)   . Anxiety   . Chest wall pain   . History of kidney stones   . Renal disorder    kidney stones    Past Surgical History:  Procedure Laterality Date  . TONSILLECTOMY    . WISDOM TOOTH EXTRACTION      Home Medications:  Current Meds  Medication Sig  . atomoxetine (STRATTERA) 40 MG capsule Take 1 capsule (40 mg total) by mouth daily. Give generic (Patient taking differently: Take 40 mg by mouth daily as needed (Anxiety). Give generic)  . Potassium 99 MG TABS Take 99 mg by mouth daily.    Allergies: Not on File  Family History  Problem Relation Age of Onset  . Diabetes Mother   . Hypertension Mother   . Miscarriages / [place] Mother   . Hypertension Father   . Throat cancer Maternal Grandfather   . Heart disease Paternal Grandmother   . Stroke Paternal Grandmother   . Prostate cancer Neg Hx   . Colon cancer Neg Hx     Social History:  reports that he has never smoked. He has never used smokeless tobacco. He reports current alcohol use. He reports that he does not use drugs.  ROS: Intermittent right flank pain.  No significant change from prior note 01/26/2021  Physical Exam:  Vital signs in last 24 hours:   Constitutional:  Alert and  oriented, No acute distress HEENT: [place] AT, moist mucus membranes.  Trachea midline, no masses Cardiovascular: Regular rate and rhythm, no clubbing, cyanosis, or edema. Respiratory: Normal respiratory effort, lungs clear bilaterally GI: Abdomen is soft, nontender, nondistended, no abdominal masses GU: No CVA tenderness   Laboratory Data:  No results for input(s): WBC, HGB, HCT in the last 72 hours. No results for input(s): NA, K, CL, CO2, GLUCOSE, BUN, CREATININE, CALCIUM in the last 72 hours. No results for input(s): LABPT, INR in the last 72 hours. No results for input(s): LABURIN in the last 72 hours. Results for orders placed or performed in visit on 03/06/21  CULTURE, URINE COMPREHENSIVE     Status: None   Collection Time: 03/06/21  8:07 AM   Specimen: Urine   UR  Result Value Ref Range Status   Urine Culture, Comprehensive Final report  Final   Organism ID, Bacteria Comment  Final    Comment: Mixed urogenital flora 10,000-25,000 colony forming units per mL   Microscopic Examination     Status: Abnormal   Collection Time: 03/06/21  8:07 AM   Urine  Result Value Ref Range Status   WBC, UA 6-10 (A) 0 - 5 /hpf Final   RBC 3-10 (A) 0 - 2 /hpf Final   Epithelial Cells (non renal) 0-10 0 - 10 /hpf Final   Casts Present (A) None seen /  lpf Final   Cast Type Granular casts (A) N/A Final   Mucus, UA Present (A) Not Estab. Final   Bacteria, UA Few (A) None seen/Few Final     Radiologic Imaging: CT imaging personally reviewed   Impression/Assessment:   25 y.o. male with a 4 mm right distal ureteral calculus present x3 months  Plan:   Presents for ureteroscopic stone removal  Procedure was described in detail including potential risks of bleeding, infection and ureteral injury/perforation.  The need for a postoperative ureteral stent was discussed with the possibility of significant stent irritative symptoms  All questions were answered and he desires to  proceed  03/14/2021, 7:18 AM  Irineo Axon,  MD

## 2021-03-14 NOTE — Anesthesia Procedure Notes (Signed)
Procedure Name: Intubation Date/Time: 03/14/2021 9:17 AM Performed by: Clyde Lundborg, CRNA Pre-anesthesia Checklist: Patient identified, Emergency Drugs available, Suction available and Patient being monitored Patient Re-evaluated:Patient Re-evaluated prior to induction Oxygen Delivery Method: Circle system utilized Preoxygenation: Pre-oxygenation with 100% oxygen Induction Type: IV induction Ventilation: Mask ventilation without difficulty Laryngoscope Size: McGraph and 3 Tube type: Oral Tube size: 7.5 mm Number of attempts: 1 Airway Equipment and Method: Stylet,  Oral airway and Video-laryngoscopy Placement Confirmation: ETT inserted through vocal cords under direct vision,  positive ETCO2 and breath sounds checked- equal and bilateral Secured at: 21 cm Tube secured with: Tape Dental Injury: Teeth and Oropharynx as per pre-operative assessment

## 2021-03-14 NOTE — Discharge Instructions (Signed)
AMBULATORY SURGERY  DISCHARGE INSTRUCTIONS   1) The drugs that you were given will stay in your system until tomorrow so for the next 24 hours you should not:  A) Drive an automobile B) Make any legal decisions C) Drink any alcoholic beverage   2) You may resume regular meals tomorrow.  Today it is better to start with liquids and gradually work up to solid foods.  You may eat anything you prefer, but it is better to start with liquids, then soup and crackers, and gradually work up to solid foods.   3) Please notify your doctor immediately if you have any unusual bleeding, trouble breathing, redness and pain at the surgery site, drainage, fever, or pain not relieved by medication.    4) Additional Instructions:   Please contact your physician with any problems or Same Day Surgery at [phone], Monday through Friday 6 am to 4 pm, or [place] at [facility] number at [phone].   DISCHARGE INSTRUCTIONS FOR KIDNEY STONE/URETERAL STENT   MEDICATIONS:  1. Resume all your other meds from home.  2.  AZO (over-the-counter) can help with the burning/stinging when you urinate. 3.  Oxycodone is for moderate/severe pain, Rx was sent to your pharmacy. 4.  Tamsulosin and oxybutynin are for stent irritation, Rxs were sent to your pharmacy  ACTIVITY:  1. May resume regular activities in 24 hours. 2. No driving while on narcotic pain medications  3. Drink plenty of water  4. Continue to walk at home - you can still get blood clots when you are at home, so keep active, but don't over do it.  5. May return to work/school tomorrow or when you feel ready   BATHING:  1. You can shower. 2. You have a string coming from your urethra: The stent string is attached to your ureteral stent. Do not pull on this.   SIGNS/SYMPTOMS TO CALL:  Common postoperative symptoms include urinary frequency, urgency, bladder spasm and blood in the urine  Please call us if you have a fever greater than  101.5, uncontrolled nausea/vomiting, uncontrolled pain, dizziness, unable to urinate, excessively bloody urine, chest pain, shortness of breath, leg swelling, leg pain, or any other concerns or questions.   You can reach [place] at [phone].   FOLLOW-UP:  1. You we will be contacted for postop follow-up in approximately 4 weeks 2. You have a string attached to your stent, you may remove it on Friday 5/27. To do this, pull the string until the stent is completely removed. You may feel an odd sensation in your back.

## 2021-03-15 ENCOUNTER — Telehealth: Payer: Self-pay | Admitting: Urology

## 2021-03-15 ENCOUNTER — Encounter: Payer: Self-pay | Admitting: Urology

## 2021-03-15 NOTE — Telephone Encounter (Signed)
Patient states he is doing better this morning. He has noticed just a little bit of blood.

## 2021-03-15 NOTE — Telephone Encounter (Signed)
Pt called asking to speak to someone about him having some periods of incontinence post operatively. Please advise.

## 2021-03-16 ENCOUNTER — Telehealth: Payer: Self-pay

## 2021-03-16 NOTE — Telephone Encounter (Signed)
Patient called stating that he is two days post URS w/stent placement he was told to removed his stent this morning. He states that last night while using the bathroom the string got stuck on the toilet and the stent was accidentally pulled out. Patient denies fever, chills, nausea, and no cramping pain or discomfort this morning. He was told to drink plenty of water today and if he developed any of these symptoms to call our office back. He was advised that some flank pain discomfort, hematuria and dysuria can happen after stent removal and he may use OTC NSAIDs to help with any discomfort or oxybutinin and flomax given post op for residual ureteral/bladder spasms. Patient verbalized understanding. He will keep post op as scheduled

## 2021-03-19 LAB — CALCULI, WITH PHOTOGRAPH (CLINICAL LAB)
Calcium Oxalate Dihydrate: 80 %
Hydroxyapatite: 20 %
Weight Calculi: 42 mg

## 2021-03-30 ENCOUNTER — Encounter: Payer: Self-pay | Admitting: Family Medicine

## 2021-03-30 ENCOUNTER — Other Ambulatory Visit: Payer: Self-pay

## 2021-03-30 ENCOUNTER — Ambulatory Visit (INDEPENDENT_AMBULATORY_CARE_PROVIDER_SITE_OTHER): Payer: 59 | Admitting: Family Medicine

## 2021-03-30 VITALS — BP 103/71 | HR 64 | Temp 97.9°F | Ht 67.0 in | Wt 254.8 lb

## 2021-03-30 DIAGNOSIS — E669 Obesity, unspecified: Secondary | ICD-10-CM | POA: Diagnosis not present

## 2021-03-30 DIAGNOSIS — M4310 Spondylolisthesis, site unspecified: Secondary | ICD-10-CM | POA: Insufficient documentation

## 2021-03-30 DIAGNOSIS — Z111 Encounter for screening for respiratory tuberculosis: Secondary | ICD-10-CM

## 2021-03-30 DIAGNOSIS — N200 Calculus of kidney: Secondary | ICD-10-CM | POA: Diagnosis not present

## 2021-03-30 DIAGNOSIS — R739 Hyperglycemia, unspecified: Secondary | ICD-10-CM | POA: Diagnosis not present

## 2021-03-30 DIAGNOSIS — M4300 Spondylolysis, site unspecified: Secondary | ICD-10-CM

## 2021-03-30 DIAGNOSIS — Z0001 Encounter for general adult medical examination with abnormal findings: Secondary | ICD-10-CM

## 2021-03-30 DIAGNOSIS — E785 Hyperlipidemia, unspecified: Secondary | ICD-10-CM

## 2021-03-30 LAB — COMPREHENSIVE METABOLIC PANEL
ALT: 32 U/L (ref 0–53)
AST: 48 U/L — ABNORMAL HIGH (ref 0–37)
Albumin: 4.6 g/dL (ref 3.5–5.2)
Alkaline Phosphatase: 60 U/L (ref 39–117)
BUN: 17 mg/dL (ref 6–23)
CO2: 27 mEq/L (ref 19–32)
Calcium: 10 mg/dL (ref 8.4–10.5)
Chloride: 104 mEq/L (ref 96–112)
Creatinine, Ser: 0.87 mg/dL (ref 0.40–1.50)
GFR: 120.55 mL/min (ref >60.00)
Glucose, Bld: 93 mg/dL (ref 70–99)
Potassium: 4.1 mEq/L (ref 3.5–5.1)
Sodium: 140 mEq/L (ref 135–145)
Total Bilirubin: 0.6 mg/dL (ref 0.2–1.2)
Total Protein: 7.7 g/dL (ref 6.0–8.3)

## 2021-03-30 LAB — CBC
HCT: 42 % (ref 39.0–52.0)
Hemoglobin: 14.3 g/dL (ref 13.0–17.0)
MCHC: 34 g/dL (ref 30.0–36.0)
MCV: 86.1 fl (ref 78.0–100.0)
Platelets: 201 10*3/uL (ref 150.0–400.0)
RBC: 4.88 Mil/uL (ref 4.22–5.81)
RDW: 13.7 % (ref 11.5–15.5)
WBC: 8.6 10*3/uL (ref 4.0–10.5)

## 2021-03-30 LAB — TSH: TSH: 2.68 u[IU]/mL (ref 0.35–4.50)

## 2021-03-30 LAB — HEMOGLOBIN A1C: Hgb A1c MFr Bld: 5.5 % (ref 4.6–6.5)

## 2021-03-30 LAB — LIPID PANEL
Cholesterol: 231 mg/dL — ABNORMAL HIGH (ref 0–200)
HDL: 37.2 mg/dL — ABNORMAL LOW (ref >39.00)
LDL Cholesterol: 168 mg/dL — ABNORMAL HIGH (ref 0–99)
NonHDL: 193.37
Total CHOL/HDL Ratio: 6
Triglycerides: 125 mg/dL (ref 0.0–149.0)
VLDL: 25 mg/dL (ref 0.0–40.0)

## 2021-03-30 MED ORDER — OZEMPIC (0.25 OR 0.5 MG/DOSE) 2 MG/1.5ML ~~LOC~~ SOPN
0.5000 mg | PEN_INJECTOR | SUBCUTANEOUS | 3 refills | Status: DC
  Filled 2021-03-30: qty 1.5, 28d supply, fill #0
  Filled 2021-05-02: qty 1.5, 28d supply, fill #1

## 2021-03-30 NOTE — Patient Instructions (Signed)
It was very nice to see you today!  We will check blood work today.  We will complete your physical form for school.  Please start the Ozempic.  Take 0.25 mg weekly for 4 weeks and then increase to 0.5 mg weekly.  Please check in with me in about a month or so to let me know how this is going.  We can increase the dose all the way to 2 mg weekly.  I will see you back in 1 year for your next physical.  Come back to see me sooner if needed.  Take care, Dr Jimmey Ralph  PLEASE NOTE:  If you had any lab tests please let us know if you have not heard back within a few days. You may see your results on mychart before we have a chance to review them but we will give you a call once they are reviewed by [place]. If we ordered any referrals today, please let us know if you have not heard from their office within the next week.   Please try these tips to maintain a healthy lifestyle:  Eat at least 3 REAL meals and 1-2 snacks per day.  Aim for no more than 5 hours between eating.  If you eat breakfast, please do so within one hour of getting up.   Each meal should contain half fruits/vegetables, one quarter protein, and one quarter carbs (no bigger than a computer mouse)  Cut down on sweet beverages. This includes juice, soda, and sweet tea.   Drink at least 1 glass of water with each meal and aim for at least 8 glasses per day  Exercise at least 150 minutes every week.   Preventive Care 10-40 Years Old, Male Preventive care refers to lifestyle choices and visits with your health care provider that can promote health and wellness. This includes: A yearly physical exam. This is also called an annual wellness visit. Regular dental and eye exams. Immunizations. Screening for certain conditions. Healthy lifestyle choices, such as: Eating a healthy diet. Getting regular exercise. Not using drugs or products that contain nicotine and tobacco. Limiting alcohol use. What can I expect for my preventive care  visit? Physical exam Your health care provider may check your: Height and weight. These may be used to calculate your BMI (body mass index). BMI is a measurement that tells if you are at a healthy weight. Heart rate and blood pressure. Body temperature. Skin for abnormal spots. Counseling Your health care provider may ask you questions about your: Past medical problems. Family's medical history. Alcohol, tobacco, and drug use. Emotional well-being. Home life and relationship well-being. Sexual activity. Diet, exercise, and sleep habits. Work and work Astronomer. Access to firearms. What immunizations do I need? Vaccines are usually given at various ages, according to a schedule. Your health care provider will recommend vaccines for you based on your age, medical history, and lifestyle or other factors, such as travel or where you work.   What tests do I need? Blood tests Lipid and cholesterol levels. These may be checked every 5 years starting at age 24. Hepatitis C test. Hepatitis B test. Screening Diabetes screening. This is done by checking your blood sugar (glucose) after you have not eaten for a while (fasting). Genital exam to check for testicular cancer or hernias. STD (sexually transmitted disease) testing, if you are at risk. Talk with your health care provider about your test results, treatment options, and if necessary, the need for more tests.   Follow  these instructions at home: Eating and drinking Eat a healthy diet that includes fresh fruits and vegetables, whole grains, lean protein, and low-fat dairy products. Drink enough fluid to keep your urine pale yellow. Take vitamin and mineral supplements as recommended by your health care provider. Do not drink alcohol if your health care provider tells you not to drink. If you drink alcohol: Limit how much you have to 0-2 drinks a day. Be aware of how much alcohol is in your drink. In the [place] one drink equals one 12  oz bottle of beer (355 mL), one 5 oz glass of wine (148 mL), or one 1 oz glass of hard liquor (44 mL).   Lifestyle Take daily care of your teeth and gums. Brush your teeth every morning and night with fluoride toothpaste. Floss one time each day. Stay active. Exercise for at least 30 minutes 5 or more days each week. Do not use any products that contain nicotine or tobacco, such as cigarettes, e-cigarettes, and chewing tobacco. If you need help quitting, ask your health care provider. Do not use drugs. If you are sexually active, practice safe sex. Use a condom or other form of protection to prevent STIs (sexually transmitted infections). Find healthy ways to cope with stress, such as: Meditation, yoga, or listening to music. Journaling. Talking to a trusted person. Spending time with friends and family. Safety Always wear your seat belt while driving or riding in a vehicle. Do not drive: If you have been drinking alcohol. Do not ride with someone who has been drinking. When you are tired or distracted. While texting. Wear a helmet and other protective equipment during sports activities. If you have firearms in your house, make sure you follow all gun safety procedures. Seek help if you have been physically or sexually abused. What's next? Go to your health care provider once a year for an annual wellness visit. Ask your health care provider how often you should have your eyes and teeth checked. Stay up to date on all vaccines. This information is not intended to replace advice given to you by your health care provider. Make sure you discuss any questions you have with your health care provider. Document Revised: 06/24/2019 Document Reviewed: 10/02/2018 Elsevier Patient Education  2021 [employer].

## 2021-03-30 NOTE — Assessment & Plan Note (Signed)
Discussed lifestyle modifications.  Start Ozempic 0.25 mg weekly.  We will titrate dose up as needed.  He will check with me in a few weeks via MyChart.

## 2021-03-30 NOTE — Assessment & Plan Note (Signed)
Check lipids. Discussed lifestyle modifications.  

## 2021-03-30 NOTE — Assessment & Plan Note (Signed)
No red flags.  We will continue with watchful waiting.  Discussed reasons to return to care.

## 2021-03-30 NOTE — Assessment & Plan Note (Signed)
Follows with urology.  Come to have calcium oxalate stones.  He is taking potassium citrate supplements.  Advised him to discuss with urology if he should start additional medication such as HCTZ to prevent recurrence.

## 2021-03-30 NOTE — Progress Notes (Signed)
Chief Complaint:  Jason Wheeler is a 25 y.o. male who presents today for his annual comprehensive physical exam.    Assessment/Plan:  Chronic Problems Addressed Today: Pars defect with spondylolisthesis No red flags.  We will continue with watchful waiting.  Discussed reasons to return to care.  Kidney stones Follows with urology.  Come to have calcium oxalate stones.  He is taking potassium citrate supplements.  Advised him to discuss with urology if he should start additional medication such as HCTZ to prevent recurrence.  Dyslipidemia Check lipids.  Discussed lifestyle modifications.  Obesity Discussed lifestyle modifications.  Start Ozempic 0.25 mg weekly.  We will titrate dose up as needed.  He will check with me in a few weeks via MyChart.   Body mass index is 39.91 kg/m. / Obese  BMI Metric Follow Up - 03/30/21 0843       BMI Metric Follow Up-Please document annually   BMI Metric Follow Up Education provided              Preventative Healthcare: Check labs today.  Up-to-date on vaccines.  Patient Counseling(The following topics were reviewed and/or handout was given):  -Nutrition: Stressed importance of moderation in sodium/caffeine intake, saturated fat and cholesterol, caloric balance, sufficient intake of fresh fruits, vegetables, and fiber.  -Stressed the importance of regular exercise.   -Substance Abuse: Discussed cessation/primary prevention of tobacco, alcohol, or other drug use; driving or other dangerous activities under the influence; availability of treatment for abuse.   -Injury prevention: Discussed safety belts, safety helmets, smoke detector, smoking near bedding or upholstery.   -Sexuality: Discussed sexually transmitted diseases, partner selection, use of condoms, avoidance of unintended pregnancy and contraceptive alternatives.   -Dental health: Discussed importance of regular tooth brushing, flossing, and dental visits.  -Health maintenance  and immunizations reviewed. Please refer to Health maintenance section.  Return to care in 1 year for next preventative visit.     Subjective:  HPI:  He has no acute complaints today.   Lifestyle Diet: Balanced.  Exercise: Trying to go the gym more frequently.   Depression screen PHQ 2/9 03/30/2021  Decreased Interest 0  Down, Depressed, Hopeless 0  PHQ - 2 Score 0    Health Maintenance Due  Topic Date Due   HIV Screening  Never done   Hepatitis C Screening  Never done   COVID-19 Vaccine (3 - Pfizer risk series) 07/17/2020     ROS: Per HPI, otherwise a complete review of systems was negative.   PMH:  The following were reviewed and entered/updated in epic: Past Medical History:  Diagnosis Date   ADHD (attention deficit hyperactivity disorder)    Anxiety    Chest wall pain    History of kidney stones    Renal disorder    kidney stones   Patient Active Problem List   Diagnosis Date Noted   Pars defect with spondylolisthesis 03/30/2021   Obesity 05/25/2019   Kidney stones 05/25/2019   Dyslipidemia 01/15/2019   ADHD 01/14/2019   Past Surgical History:  Procedure Laterality Date   CYSTOSCOPY/URETEROSCOPY/HOLMIUM LASER/STENT PLACEMENT Right 03/14/2021   Procedure: CYSTOSCOPY/URETEROSCOPY/HOLMIUM LASER/ POSSIBLE STENT PLACEMENT;  Surgeon: Riki Altes, MD;  Location: [facility] ORS;  Service: Urology;  Laterality: Right;   TONSILLECTOMY     WISDOM TOOTH EXTRACTION      Family History  Problem Relation Age of Onset   Diabetes Mother    Hypertension Mother    Miscarriages / [place] Mother    Hypertension Father  Throat cancer Maternal Grandfather    Heart disease Paternal Grandmother    Stroke Paternal Grandmother    Prostate cancer Neg Hx    Colon cancer Neg Hx     Medications- reviewed and updated Current Outpatient Medications  Medication Sig Dispense Refill   atomoxetine (STRATTERA) 40 MG capsule Take 1 capsule (40 mg total) by mouth daily. Give  generic (Patient taking differently: Take 40 mg by mouth daily as needed (Anxiety). Give generic) 30 capsule 0   oxybutynin (DITROPAN) 5 MG tablet 1 tab tid prn frequency,urgency, bladder spasm 15 tablet 0   oxyCODONE-acetaminophen (PERCOCET) 5-325 MG tablet Take 1 tablet by mouth every 6 (six) hours as needed for severe pain. 10 tablet 0   Potassium 99 MG TABS Take 99 mg by mouth daily.     Semaglutide,0.25 or 0.5MG /DOS, (OZEMPIC, 0.25 OR 0.5 MG/DOSE,) 2 MG/1.5ML SOPN Inject 0.5 mg into the skin once a week. 1.5 mL 3   tamsulosin (FLOMAX) 0.4 MG CAPS capsule Take 1 capsule (0.4 mg total) by mouth daily after breakfast. 14 capsule 0   No current facility-administered medications for this visit.    Allergies-reviewed and updated No Known Allergies  Social History   Socioeconomic History   Marital status: Single    Spouse name: Not on file   Number of children: Not on file   Years of education: Not on file   Highest education level: Not on file  Occupational History   Occupation: NURSE TECH  Tobacco Use   Smoking status: Never   Smokeless tobacco: Never  Vaping Use   Vaping Use: Never used  Substance and Sexual Activity   Alcohol use: Yes    Comment: 1 beer once a month   Drug use: Never   Sexual activity: Not on file  Other Topics Concern   Not on file  Social History Narrative   Lives with room mate.  Feels safe in his home.   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 Wt Readings from Last 3 Encounters:  03/30/21 254 lb 12.8 oz (115.6 kg)   03/09/21 240 lb (108.9 kg)  03/02/21 240 lb (108.9 kg)   Gen: NAD, resting comfortably HEENT: TMs normal bilaterally. OP clear. No thyromegaly noted.  CV: RRR with no murmurs appreciated Pulm: NWOB, CTAB with no crackles, wheezes, or rhonchi GI: Normal bowel sounds present. Soft, Nontender, Nondistended. MSK: no edema, cyanosis, or clubbing noted Skin: warm, dry Neuro: CN2-12 grossly intact. Strength 5/5 in upper and lower extremities. Reflexes symmetric and intact bilaterally.  Psych: Normal affect and thought content     Lainy Wrobleski M. 05/02/21, MD 03/30/2021 8:43 AM

## 2021-04-01 LAB — QUANTIFERON-TB GOLD PLUS
Mitogen-NIL: >10 IU/mL
NIL: 0.03 IU/mL
QuantiFERON-TB Gold Plus: NEGATIVE
TB1-NIL: 0.01 IU/mL
TB2-NIL: 0.01 IU/mL

## 2021-04-02 ENCOUNTER — Encounter (INDEPENDENT_AMBULATORY_CARE_PROVIDER_SITE_OTHER): Payer: Self-pay

## 2021-04-03 ENCOUNTER — Telehealth: Payer: Self-pay

## 2021-04-03 NOTE — Progress Notes (Signed)
Please inform patient of the following:  Cholesterol levels are borderline. TB test is negative. It is ok to complete his form for work.  We can recheck everything in a year.  Katina Degree. Jimmey Ralph, MD 04/03/2021 8:56 AM

## 2021-04-03 NOTE — Telephone Encounter (Signed)
Patient is calling in wondering if he can come pick up his paperwork he left with [place] last week, tomorrow. Also wondering if someone is able to give him a call as he states he needed some vaccinations but it wasn't done correctly.

## 2021-04-04 ENCOUNTER — Encounter: Payer: Self-pay | Admitting: Family Medicine

## 2021-04-04 NOTE — Telephone Encounter (Signed)
Patient aware, vaccination report was printed and placed at front office to be pick up  Vaccine Titers were not done, if needed will placed orders

## 2021-04-04 NOTE — Telephone Encounter (Signed)
Left detailed message forms ready for pick up and to call clinic if needed,forms placed front office.

## 2021-04-05 ENCOUNTER — Other Ambulatory Visit: Payer: Self-pay

## 2021-04-05 ENCOUNTER — Telehealth: Payer: Self-pay

## 2021-04-05 DIAGNOSIS — Z1159 Encounter for screening for other viral diseases: Secondary | ICD-10-CM

## 2021-04-05 DIAGNOSIS — Z789 Other specified health status: Secondary | ICD-10-CM

## 2021-04-05 NOTE — Progress Notes (Signed)
error 

## 2021-04-06 ENCOUNTER — Other Ambulatory Visit: Payer: 59

## 2021-04-06 ENCOUNTER — Other Ambulatory Visit: Payer: Self-pay

## 2021-04-06 DIAGNOSIS — Z1159 Encounter for screening for other viral diseases: Secondary | ICD-10-CM

## 2021-04-06 DIAGNOSIS — Z789 Other specified health status: Secondary | ICD-10-CM

## 2021-04-06 NOTE — Addendum Note (Signed)
Addended by: Lorn Junes on: 04/06/2021 03:11 PM   Modules accepted: Orders

## 2021-04-07 LAB — HEPATITIS B SURFACE ANTIGEN: Hepatitis B Surface Ag: NONREACTIVE

## 2021-04-07 LAB — MEASLES/MUMPS/RUBELLA IMMUNITY
Mumps IgG: 64.3 AU/mL
Rubella: 2.23 Index
Rubeola IgG: 85 AU/mL

## 2021-04-07 LAB — HEPATITIS B SURFACE ANTIBODY, QUANTITATIVE: Hep B S AB Quant (Post): 121 m[IU]/mL (ref >=10)

## 2021-04-07 LAB — VARICELLA ZOSTER ANTIBODY, IGG: Varicella IgG: 665.1 index

## 2021-04-07 LAB — HEPATITIS B CORE ANTIBODY, IGM: Hep B C IgM: NONREACTIVE

## 2021-04-10 NOTE — Telephone Encounter (Signed)
error 

## 2021-04-11 NOTE — Progress Notes (Signed)
Please inform patient of the following:  All of his vaccine titers are normal.  Bryan Omura M. Jimmey Ralph, MD 04/11/2021 5:16 PM

## 2021-04-14 ENCOUNTER — Other Ambulatory Visit: Payer: Self-pay

## 2021-04-14 ENCOUNTER — Ambulatory Visit (INDEPENDENT_AMBULATORY_CARE_PROVIDER_SITE_OTHER): Payer: 59 | Admitting: Urology

## 2021-04-14 ENCOUNTER — Encounter: Payer: Self-pay | Admitting: Urology

## 2021-04-14 VITALS — BP 103/73 | HR 77 | Ht 66.0 in | Wt 250.0 lb

## 2021-04-14 DIAGNOSIS — N200 Calculus of kidney: Secondary | ICD-10-CM | POA: Diagnosis not present

## 2021-04-14 NOTE — Progress Notes (Signed)
04/14/2021 8:37 AM   Jason Wheeler 1996-05-01 [id number]  Referring provider: Ardith Dark, MD [address] [place],  [place] [address]  Chief Complaint  Patient presents with   Other    HPI: 25 y.o. male presents for postop follow-up.  Status post ureteroscopic removal of a distal ureteral calculus 03/14/2021 No postoperative problems He accidentally removed stent on postop day 2 (was to remove POD3) Had no problems with stent removal Stone analysis CaOxDi/hydroxyapatite 80/20 Prior metabolic evaluation [facility] 09/2017 with findings of low urine volume, hypercalciuria, hyperoxaluria, hyperuricosuria, hypocitraturia  24- Hour Urine Litholink Results (reference range) Date of Study: 04/04/2017 Volume (liters): 1.65 (0.5 - 4L) Supersaturation Calcium oxalate: 10.46 (6-10) Calcium (mg/d): 277 (M < 250; F <200) Oxalate (mg/d): 52 (20-40) Citrate (mg/d): 366 (M < 450; F <550) Supersaturation Calcium phosphate: 3.23 (0.5 - 2) Urine pH: 6.466 (2.8 - 6.2) Supersaturation Uric acid: 0.41 (0-1) Uric acid (g/d): 0.890 (M < 0.80; F <0.750) Sodium (mmol/d): 229 (50-150) Creatinine / Kg (mg/kg/d): 19.3 (M <18-24; F <15-20)  Initial dietary prevention measures were recommended with a follow-up 24-hour urine recommended   PMH: Past Medical History:  Diagnosis Date   ADHD (attention deficit hyperactivity disorder)    Anxiety    Chest wall pain    History of kidney stones    Renal disorder    kidney stones    Surgical History: Past Surgical History:  Procedure Laterality Date   CYSTOSCOPY/URETEROSCOPY/HOLMIUM LASER/STENT PLACEMENT Right 03/14/2021   Procedure: CYSTOSCOPY/URETEROSCOPY/HOLMIUM LASER/ POSSIBLE STENT PLACEMENT;  Surgeon: Riki Altes, MD;  Location: [facility] ORS;  Service: Urology;  Laterality: Right;   TONSILLECTOMY     WISDOM TOOTH EXTRACTION      Home Medications:  Allergies as of 04/14/2021   No Known Allergies       Medication List        Accurate as of April 14, 2021  8:37 AM. If you have any questions, ask your nurse or doctor.          STOP taking these medications    oxybutynin 5 MG tablet Commonly known as: DITROPAN Stopped by: Riki Altes, MD   oxyCODONE-acetaminophen 5-325 MG tablet Commonly known as: Percocet Stopped by: Riki Altes, MD   tamsulosin 0.4 MG Caps capsule Commonly known as: FLOMAX Stopped by: Riki Altes, MD       TAKE these medications    atomoxetine 40 MG capsule Commonly known as: STRATTERA Take 1 capsule (40 mg total) by mouth daily. Give generic What changed:  when to take this reasons to take this   Ozempic (0.25 or 0.5 MG/DOSE) 2 MG/1.5ML Sopn Generic drug: Semaglutide(0.25 or 0.5MG /DOS) Inject 0.5 mg into the skin once a week.   Potassium 99 MG Tabs Take 99 mg by mouth daily.        Allergies: No Known Allergies  Family History: Family History  Problem Relation Age of Onset   Diabetes Mother    Hypertension Mother    Miscarriages / Stillbirths Mother    Hypertension Father    Throat cancer Maternal Grandfather    Heart disease Paternal Grandmother    Stroke Paternal Grandmother    Prostate cancer Neg Hx    Colon cancer Neg Hx     Social History:  reports that he has never smoked. He has never used smokeless tobacco. He reports current alcohol use. He reports that he does not use drugs.   Physical Exam: BP 103/73  Pulse 77   Ht 5\' 6"  (1.676 m)   Wt 250 lb (113.4 kg)   BMI 40.35 kg/m   Constitutional:  Alert and oriented, No acute distress. HEENT: [place] AT, moist mucus membranes.  Trachea midline, no masses. Cardiovascular: No clubbing, cyanosis, or edema. Respiratory: Normal respiratory effort, no increased work of breathing.   Assessment & Plan:   Doing well status post ureteroscopic stone removal Several abnormalities on prior 24 urine study in 2018 Recommend repeat metabolic evaluation Will contact with  results Follow-up 1 year with KUB   2019, MD  [facility] [address], [address] [place], [place] [phone]

## 2021-04-14 NOTE — Patient Instructions (Signed)

## 2021-04-18 DIAGNOSIS — Z760 Encounter for issue of repeat prescription: Secondary | ICD-10-CM | POA: Diagnosis not present

## 2021-05-02 ENCOUNTER — Other Ambulatory Visit: Payer: 59

## 2021-05-02 ENCOUNTER — Other Ambulatory Visit: Payer: Self-pay

## 2021-05-02 DIAGNOSIS — N200 Calculus of kidney: Secondary | ICD-10-CM | POA: Diagnosis not present

## 2021-05-12 ENCOUNTER — Telehealth: Payer: Self-pay | Admitting: *Deleted

## 2021-05-12 NOTE — Telephone Encounter (Signed)
Pt calling stating he bought his litholink in on 05/02/2021 and yesterday he received another kit? Pt wanted to know if he should do the 2nd kit as well? Please advise

## 2021-05-12 NOTE — Telephone Encounter (Signed)
Notified patient to redo the litholink . Patient will call back and rescheduled

## 2021-05-16 ENCOUNTER — Encounter (INDEPENDENT_AMBULATORY_CARE_PROVIDER_SITE_OTHER): Payer: Self-pay

## 2021-05-16 ENCOUNTER — Encounter: Payer: Self-pay | Admitting: Urology

## 2021-05-16 ENCOUNTER — Other Ambulatory Visit: Payer: Self-pay | Admitting: Urology

## 2021-05-23 NOTE — Telephone Encounter (Signed)
Pt called office asking if he needs to schedule a follow up appt from 24 hr urine drop off.

## 2021-05-25 ENCOUNTER — Other Ambulatory Visit: Payer: Self-pay | Admitting: Urology

## 2021-05-30 ENCOUNTER — Other Ambulatory Visit: Payer: Self-pay | Admitting: Urology

## 2021-05-31 ENCOUNTER — Encounter: Payer: Self-pay | Admitting: Urology

## 2021-05-31 ENCOUNTER — Other Ambulatory Visit: Payer: Self-pay

## 2021-05-31 ENCOUNTER — Ambulatory Visit (INDEPENDENT_AMBULATORY_CARE_PROVIDER_SITE_OTHER): Payer: 59 | Admitting: Urology

## 2021-05-31 VITALS — BP 118/76 | HR 98 | Ht 66.0 in | Wt 230.0 lb

## 2021-05-31 DIAGNOSIS — N200 Calculus of kidney: Secondary | ICD-10-CM

## 2021-05-31 DIAGNOSIS — R82994 Hypercalciuria: Secondary | ICD-10-CM

## 2021-05-31 DIAGNOSIS — R82993 Hyperuricosuria: Secondary | ICD-10-CM | POA: Diagnosis not present

## 2021-05-31 MED ORDER — INDAPAMIDE 2.5 MG PO TABS
2.5000 mg | ORAL_TABLET | Freq: Every day | ORAL | 1 refills | Status: DC
  Filled 2021-05-31: qty 90, 90d supply, fill #0
  Filled 2021-08-20 – 2021-08-28 (×2): qty 90, 90d supply, fill #1

## 2021-05-31 NOTE — Patient Instructions (Signed)
Litholink Instructions [facility]   You will receive a box/kit in the mail that will have a urine jug and instructions in the kit.  When the box arrives you will need to call our office [phone] to schedule a LAB appointment.   You will need to do a 24hour urine and this should be done during the days that our office will be open.  For example any day from Sunday through Thursday.   If you take Vitamin C 151m or greater please stop this 5 days prior to collection.   How to collect the urine sample: On the day you start the urine sample this 1st morning urine should NOT be collected.  For the rest of the day including all night urines should be collected.  On the next morning the 1st urine should be collected and then you will be finished with the urine collections.   You will need to bring the box with you on your LAB appointment day after urine has been collected and all instructions are complete in the box.  Your blood will be drawn and the box will be collected by our Lab employee to be sent off for analysis.   When urine and blood is complete you will need to schedule a follow up appointment for lab results.

## 2021-05-31 NOTE — Progress Notes (Signed)
05/31/2021 3:55 PM   Jason Wheeler 11-30-1995 [id number]  Referring provider: Ardith Dark, MD [address] [place],  [place] [address]  Chief Complaint  Patient presents with   Follow-up    HPI: 25 y.o. male presents for review of recent metabolic stone evaluation  Recurrent stone disease most recently ureteroscopic removal of a right distal ureteral calculus 03/14/2021 Stone analysis CaOxDi/hydroxyapatite 80/20% Prior metabolic evaluation [facility] 09/2017 with findings of low urine volume, hypercalciuria, hyperoxaluria, hyperuricosuria, hypocitraturia Repeat 24-hour urine completed 05/02/2021 remarkable for low urine volume at 1.6 L, hypercalciuria at 360 mg, hypocitraturia at 377 mg elevated supersaturation of calcium phosphate at 3.84 uric acid slightly elevated at 0.918 g Serum studies were unremarkable Most recent studies similar to prior evaluation and 2018 with the exception of increased hypercalciuria and resolution of his hyperoxaluria    PMH: Past Medical History:  Diagnosis Date   ADHD (attention deficit hyperactivity disorder)    Anxiety    Chest wall pain    History of kidney stones    Renal disorder    kidney stones    Surgical History: Past Surgical History:  Procedure Laterality Date   CYSTOSCOPY/URETEROSCOPY/HOLMIUM LASER/STENT PLACEMENT Right 03/14/2021   Procedure: CYSTOSCOPY/URETEROSCOPY/HOLMIUM LASER/ POSSIBLE STENT PLACEMENT;  Surgeon: Riki Altes, MD;  Location: [facility] ORS;  Service: Urology;  Laterality: Right;   TONSILLECTOMY     WISDOM TOOTH EXTRACTION      Home Medications:  Allergies as of 05/31/2021   No Known Allergies      Medication List        Accurate as of May 31, 2021  3:55 PM. If you have any questions, ask your nurse or doctor.          atomoxetine 40 MG capsule Commonly known as: STRATTERA Take 1 capsule (40 mg total) by mouth daily. Give generic What changed:  when to take  this reasons to take this   Ozempic (0.25 or 0.5 MG/DOSE) 2 MG/1.5ML Sopn Generic drug: Semaglutide(0.25 or 0.5MG /DOS) Inject 0.5 mg into the skin once a week.   Potassium 99 MG Tabs Take 99 mg by mouth daily.        Allergies: No Known Allergies  Family History: Family History  Problem Relation Age of Onset   Diabetes Mother    Hypertension Mother    Miscarriages / Stillbirths Mother    Hypertension Father    Throat cancer Maternal Grandfather    Heart disease Paternal Grandmother    Stroke Paternal Grandmother    Prostate cancer Neg Hx    Colon cancer Neg Hx     Social History:  reports that he has never smoked. He has never used smokeless tobacco. He reports current alcohol use. He reports that he does not use drugs.   Physical Exam: BP 118/76   Pulse 98   Ht 5\' 6"  (1.676 m)   Wt 230 lb (104.3 kg)   BMI 37.12 kg/m   Constitutional:  Alert and oriented, No acute distress. HEENT: [place] AT, moist mucus membranes.  Trachea midline, no masses. Cardiovascular: No clubbing, cyanosis, or edema. Respiratory: Normal respiratory effort, no increased work of breathing.   Laboratory Data:    Assessment & Plan:    1. Recurrent nephrolithiasis [place] evaluation remarkable for low urine volume, hypercalciuria, hypocitraturia and hyperuricosuria Elevated supersaturation calcium phosphate Most recent stone was 20% hydroxyapatite Recommend starting a thiazide for his hypercalciuria.  Potential side effects discussed including hyperglycemia and hypokalemia though he is on potassium  supplementation Rx indapamide sent to pharmacy Change potassium supplementation to potassium citrate.  We also discussed increasing citrus beverages He was also given literature and samples of LithoLyte Increase water intake to keep urine output >2.5 L/day Repeat 24-hour urine study 6 months, follow-up office visit with KUB   Riki Altes, MD  [facility] [address] [place], [place] [address] [phone]

## 2021-06-02 ENCOUNTER — Other Ambulatory Visit: Payer: Self-pay

## 2021-06-02 MED ORDER — OZEMPIC (0.25 OR 0.5 MG/DOSE) 2 MG/1.5ML ~~LOC~~ SOPN
0.5000 mg | PEN_INJECTOR | SUBCUTANEOUS | 3 refills | Status: DC
Start: 2021-06-02 — End: 2021-06-28
  Filled 2021-06-02: qty 1.5, 28d supply, fill #0

## 2021-06-02 NOTE — Telephone Encounter (Signed)
.   Encourage patient to contact the pharmacy for refills or they can request refills through [facility]  LAST APPOINTMENT DATE:  Please schedule appointment if longer than 1 year  NEXT APPOINTMENT DATE:  MEDICATION:Semaglutide,0.25 or 0.5MG /DOS, (OZEMPIC, 0.25 OR 0.5 MG/DOSE,) 2 MG/1.5ML SOPN  Is the patient out of medication?   [facility]

## 2021-06-02 NOTE — Telephone Encounter (Signed)
Rx has been refilled.  

## 2021-06-07 ENCOUNTER — Other Ambulatory Visit (HOSPITAL_COMMUNITY): Payer: Self-pay | Admitting: Family Medicine

## 2021-06-07 ENCOUNTER — Other Ambulatory Visit: Payer: Self-pay

## 2021-06-07 MED ORDER — ATOMOXETINE HCL 40 MG PO CAPS
40.0000 mg | ORAL_CAPSULE | Freq: Every day | ORAL | 0 refills | Status: DC
  Filled 2021-06-07: qty 30, 30d supply, fill #0

## 2021-06-08 ENCOUNTER — Other Ambulatory Visit: Payer: Self-pay

## 2021-06-26 ENCOUNTER — Encounter: Payer: Self-pay | Admitting: Family Medicine

## 2021-06-28 ENCOUNTER — Other Ambulatory Visit: Payer: Self-pay

## 2021-06-28 MED ORDER — SEMAGLUTIDE (1 MG/DOSE) 4 MG/3ML ~~LOC~~ SOPN
1.0000 mg | PEN_INJECTOR | SUBCUTANEOUS | 5 refills | Status: DC
  Filled 2021-06-28: qty 3, 28d supply, fill #0
  Filled 2021-07-27 – 2021-07-31 (×2): qty 3, 28d supply, fill #1
  Filled 2021-08-20: qty 3, 28d supply, fill #2
  Filled 2021-08-28: qty 9, 84d supply, fill #2
  Filled 2021-11-02: qty 3, 28d supply, fill #3

## 2021-07-28 ENCOUNTER — Other Ambulatory Visit: Payer: Self-pay

## 2021-07-31 ENCOUNTER — Other Ambulatory Visit: Payer: Self-pay

## 2021-08-21 ENCOUNTER — Other Ambulatory Visit: Payer: Self-pay

## 2021-08-28 ENCOUNTER — Other Ambulatory Visit: Payer: Self-pay

## 2021-08-29 ENCOUNTER — Other Ambulatory Visit: Payer: Self-pay

## 2021-11-02 ENCOUNTER — Other Ambulatory Visit: Payer: Self-pay

## 2021-11-03 ENCOUNTER — Other Ambulatory Visit: Payer: Self-pay

## 2021-11-30 ENCOUNTER — Encounter: Payer: Self-pay | Admitting: Urology

## 2021-11-30 ENCOUNTER — Other Ambulatory Visit: Payer: Self-pay

## 2021-11-30 ENCOUNTER — Ambulatory Visit (INDEPENDENT_AMBULATORY_CARE_PROVIDER_SITE_OTHER): Payer: 59 | Admitting: Urology

## 2021-11-30 VITALS — BP 117/77 | HR 77 | Ht 66.0 in | Wt 250.7 lb

## 2021-11-30 DIAGNOSIS — N200 Calculus of kidney: Secondary | ICD-10-CM

## 2021-11-30 MED ORDER — INDAPAMIDE 2.5 MG PO TABS
2.5000 mg | ORAL_TABLET | Freq: Every day | ORAL | 3 refills | Status: DC
  Filled 2021-11-30: qty 90, 90d supply, fill #0
  Filled 2022-05-20: qty 90, 90d supply, fill #1

## 2021-11-30 NOTE — Patient Instructions (Signed)
Dietary Guidelines to Help Prevent Kidney Stones Kidney stones are deposits of minerals and salts that form inside your kidneys. Your risk of developing kidney stones may be greater depending on your diet, your lifestyle, the medicines you take, and whether you have certain medical conditions. Most people can lower their chances of developing kidney stones by following the instructions below. Your dietitian may give you more specific instructions depending on your overall health and the type of kidney stones you tend to develop. What are tips for following this plan? Reading food labels  Choose foods with "no salt added" or "low-salt" labels. Limit your salt (sodium) intake to less than 1,500 mg a day. Choose foods with calcium for each meal and snack. Try to eat about 300 mg of calcium at each meal. Foods that contain 200-500 mg of calcium a serving include: 8 oz (237 mL) of milk, calcium-fortifiednon-dairy milk, and calcium-fortifiedfruit juice. Calcium-fortified means that calcium has been added to these drinks. 8 oz (237 mL) of kefir, yogurt, and soy yogurt. 4 oz (114 g) of tofu. 1 oz (28 g) of cheese. 1 cup (150 g) of dried figs. 1 cup (91 g) of cooked broccoli. One 3 oz (85 g) can of sardines or mackerel. Most people need 1,000-1,500 mg of calcium a day. Talk to your dietitian about how much calcium is recommended for you. Shopping Buy plenty of fresh fruits and vegetables. Most people do not need to avoid fruits and vegetables, even if these foods contain nutrients that may contribute to kidney stones. When shopping for convenience foods, choose: Whole pieces of fruit. Pre-made salads with dressing on the side. Low-fat fruit and yogurt smoothies. Avoid buying frozen meals or prepared deli foods. These can be high in sodium. Look for foods with live cultures, such as yogurt and kefir. Choose high-fiber grains, such as whole-wheat breads, oat bran, and wheat cereals. Cooking Do not add  salt to food when cooking. Place a salt shaker on the table and allow each person to add his or her own salt to taste. Use vegetable protein, such as beans, textured vegetable protein (TVP), or tofu, instead of meat in pasta, casseroles, and soups. Meal planning Eat less salt, if told by your dietitian. To do this: Avoid eating processed or pre-made food. Avoid eating fast food. Eat less animal protein, including cheese, meat, poultry, or fish, if told by your dietitian. To do this: Limit the number of times you have meat, poultry, fish, or cheese each week. Eat a diet free of meat at least 2 days a week. Eat only one serving each day of meat, poultry, fish, or seafood. When you prepare animal protein, cut pieces into small portion sizes. For most meat and fish, one serving is about the size of the palm of your hand. Eat at least five servings of fresh fruits and vegetables each day. To do this: Keep fruits and vegetables on hand for snacks. Eat one piece of fruit or a handful of berries with breakfast. Have a salad and fruit at lunch. Have two kinds of vegetables at dinner. Limit foods that are high in a substance called oxalate. These include: Spinach (cooked), rhubarb, beets, sweet potatoes, and Swiss chard. Peanuts. Potato chips, french fries, and baked potatoes with skin on. Nuts and nut products. Chocolate. If you regularly take a diuretic medicine, make sure to eat at least 1 or 2 servings of fruits or vegetables that are high in potassium each day. These include: Avocado. Banana. Orange, prune,   carrot, or tomato juice. Baked potato. Cabbage. Beans and split peas. Lifestyle  Drink enough fluid to keep your urine pale yellow. This is the most important thing you can do. Spread your fluid intake throughout the day. If you drink alcohol: Limit how much you use to: 0-1 drink a day for women who are not pregnant. 0-2 drinks a day for men. Be aware of how much alcohol is in your  drink. In the [place] one drink equals one 12 oz bottle of beer (355 mL), one 5 oz glass of wine (148 mL), or one 1 oz glass of hard liquor (44 mL). Lose weight if told by your health care provider. Work with your dietitian to find an eating plan and weight loss strategies that work best for you. General information Talk to your health care provider and dietitian about taking daily supplements. You may be told the following depending on your health and the cause of your kidney stones: Not to take supplements with vitamin C. To take a calcium supplement. To take a daily probiotic supplement. To take other supplements such as magnesium, fish oil, or vitamin B6. Take over-the-counter and prescription medicines only as told by your health care provider. These include supplements. What foods should I limit? Limit your intake of the following foods, or eat them as told by your dietitian. Vegetables Spinach. Rhubarb. Beets. Canned vegetables. Pickles. Olives. Baked potatoes with skin. Grains Wheat bran. Baked goods. Salted crackers. Cereals high in sugar. Meats and other proteins Nuts. Nut butters. Large portions of meat, poultry, or fish. Salted, precooked, or cured meats, such as sausages, meat loaves, and hot dogs. Dairy Cheese. Beverages Regular soft drinks. Regular vegetable juice. Seasonings and condiments Seasoning blends with salt. Salad dressings. Soy sauce. Ketchup. Barbecue sauce. Other foods Canned soups. Canned pasta sauce. Casseroles. Pizza. Lasagna. Frozen meals. Potato chips. French fries. The items listed above may not be a complete list of foods and beverages you should limit. Contact a dietitian for more information. What foods should I avoid? Talk to your dietitian about specific foods you should avoid based on the type of kidney stones you have and your overall health. Fruits Grapefruit. The item listed above may not be a complete list of foods and beverages you should  avoid. Contact a dietitian for more information. Summary Kidney stones are deposits of minerals and salts that form inside your kidneys. You can lower your risk of kidney stones by making changes to your diet. The most important thing you can do is drink enough fluid. Drink enough fluid to keep your urine pale yellow. Talk to your dietitian about how much calcium you should have each day, and eat less salt and animal protein as told by your dietitian. This information is not intended to replace advice given to you by your health care provider. Make sure you discuss any questions you have with your health care provider. Document Revised: 10/01/2019 Document Reviewed: 10/01/2019 Elsevier Patient Education  2022 Elsevier Inc.  

## 2021-11-30 NOTE — Progress Notes (Signed)
° °  11/30/2021 2:28 PM   Jason Wheeler October 12, 1996 [id number]  Reason for visit: Follow up nephrolithiasis  HPI: 26 year old male with history of recurrent nephrolithiasis previously followed by Dr. Lonna Cobb who opted to transfer his care to me.  He reports 3 prior stone episodes spontaneously passed, and most recent stone episode in May 2022 required ureteroscopy with Dr. Lonna Cobb for a 5 mm distal ureteral stone.  I personally viewed and interpreted the CT from that time on 03/02/2021 that shows the previously mentioned 5 mm right distal ureteral stone, but no other renal stones.  Stone type is a 80% calcium oxalate/20% calcium phosphate.  24-hour urine test in July 2022 notable for low urine volume of 1.63 L, elevated urine calcium of 360, elevated urine sodium of 272, normal oxalate of 24, low urine citrate of 377.  He was started on indapamide at that time by Dr. Bradly Chris for the hypercalciuria.  He also was consuming a high volume of sunflower seeds which likely explains the elevated urine sodium, and he has cut these out of his diet.  He also takes potassium citrate over-the-counter which helps with his severe cramps overnight.  We discussed general stone prevention strategies including adequate hydration with goal of producing 2.5 L of urine daily, increasing citric acid intake, increasing calcium intake during high oxalate meals, minimizing animal protein, and decreasing salt intake. Information about dietary recommendations given today.  I recommended on focusing on the continued decrease salt in the diet as this can increase the urine calcium.  He would like to stay on the indapamide and the potassium citrate since he has done well over the last 6 months.  He would like to avoid repeat x-rays unless he is symptomatic to avoid exposure.  Continue potassium citrate and indapamide, refilled RTC 1 year with 24-hour urine prior-consider discontinuing indapamide at that time pending  results   Sondra Come, MD  [facility] [address] [place], [place] [address] [phone]

## 2021-12-01 ENCOUNTER — Ambulatory Visit: Payer: 59 | Admitting: Urology

## 2021-12-07 ENCOUNTER — Other Ambulatory Visit: Payer: Self-pay

## 2021-12-07 ENCOUNTER — Encounter: Payer: Self-pay | Admitting: Family Medicine

## 2021-12-07 ENCOUNTER — Ambulatory Visit: Payer: 59 | Admitting: Family Medicine

## 2021-12-07 VITALS — BP 106/74 | HR 84 | Ht 66.0 in | Wt 250.0 lb

## 2021-12-07 DIAGNOSIS — J309 Allergic rhinitis, unspecified: Secondary | ICD-10-CM

## 2021-12-07 DIAGNOSIS — Z7689 Persons encountering health services in other specified circumstances: Secondary | ICD-10-CM | POA: Diagnosis not present

## 2021-12-07 DIAGNOSIS — S76011A Strain of muscle, fascia and tendon of right hip, initial encounter: Secondary | ICD-10-CM | POA: Diagnosis not present

## 2021-12-07 MED ORDER — WEGOVY 1.7 MG/0.75ML ~~LOC~~ SOAJ
1.7000 mg | SUBCUTANEOUS | 0 refills | Status: DC
  Filled 2021-12-07 – 2021-12-13 (×5): qty 3, 28d supply, fill #0

## 2021-12-07 NOTE — Assessment & Plan Note (Signed)
Patient with atraumatic right anterior hip pain with mild radiation into the proximal thigh ongoing for several weeks, has since resolved per patient but is concerned about the underlying cause.  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  Findings demonstrate negative straight leg raise, mild tightness throughout the posterior chain, FABER and FADIR negative, negative piriformis testing, resisted hip flexion demonstrates 4+/5 strength, this is associated with discomfort in the anterior hip, contralateral 5/5 strength and painless.  His clinical history and findings are most consistent with resolving hip flexor strain, most likely secondary to relative overuse coupled with deconditioning.  I have advised supportive care, dedicated home based exercises, and he can follow-up on as-needed basis for this issue.

## 2021-12-07 NOTE — Assessment & Plan Note (Signed)
Longstanding issue, ongoing for years, this is despite varying degrees of dietary and exercise interventions.  He has been on Ozempic through his previous primary care doctor for weight management, over the course of several months he has titrated to 1 mg/week, has been on this dose for a few months.  He tolerates the medication well without significant issue.  He has noted issues with continued weight loss which she attributes to diminished time for exercise and dietary indulgences.  We discussed his clinical concerns, the nature of the semaglutide for weight management, and he is amenable to a transition to [facility].  Additionally, we will titrate this dose to 1.7 mg after he has completed his current regimen of Ozempic 1 mg in 2 weeks time.  Return for follow-up in 6 weeks to reevaluate interval weight change and assess the need for further titration.  Chronic condition, uncontrolled, Rx management

## 2021-12-07 NOTE — Assessment & Plan Note (Signed)
Patient with several weeks of shortness of air, congestion, and frequent sneezing, gradually improving.  Reported no fevers, chills, did note incidental chest pain during multiple sneezing episodes.  Examination today reveals swollen, not erythematous turbinates bilaterally, nontender sinuses, benign oropharynx, benign tympanic membranes and canals, no cervical lymphadenopathy.  He has clear lung fields throughout without wheezes, rales, rhonchi appreciated.  Findings and history most consistent with allergic rhinitis episode with most likely accessory muscles of inspiration related chest wall pain.  I have advised patient to utilize Flonase, 2 sprays in each nostril daily x7 days then as needed.  We will be planning on ordering her risk stratification labs at his annual physical in 6 weeks.  Pending labs and if he is persistently symptomatic despite resolution of noted examination findings, cardiology referral to be considered.

## 2021-12-07 NOTE — Progress Notes (Signed)
Primary Care / Sports Medicine Office Visit  Patient Information:  Patient ID: Jason Wheeler, male DOB: July 24, 1996 Age: 26 y.o. MRN: [id number]   Jason Wheeler is a pleasant 26 y.o. male presenting with the following:  Chief Complaint  Patient presents with   New Patient (Initial Visit)   Establish Care   Weight management    Vitals:   12/07/21 1056  BP: 106/74  Pulse: 84  SpO2: 97%   Vitals:   12/07/21 1056  Weight: 250 lb (113.4 kg)  Height: 5\' 6"  (1.676 m)   Body mass index is 40.35 kg/m.  No results found.   Independent interpretation of notes and tests performed by another provider:   None  Procedures performed:   None  Pertinent History, Exam, Impression, and Recommendations:   Allergic rhinitis Patient with several weeks of shortness of air, congestion, and frequent sneezing, gradually improving.  Reported no fevers, chills, did note incidental chest pain during multiple sneezing episodes.  Examination today reveals swollen, not erythematous turbinates bilaterally, nontender sinuses, benign oropharynx, benign tympanic membranes and canals, no cervical lymphadenopathy.  He has clear lung fields throughout without wheezes, rales, rhonchi appreciated.  Findings and history most consistent with allergic rhinitis episode with most likely accessory muscles of inspiration related chest wall pain.  I have advised patient to utilize Flonase, 2 sprays in each nostril daily x7 days then as needed.  We will be planning on ordering her risk stratification labs at his annual physical in 6 weeks.  Pending labs and if he is persistently symptomatic despite resolution of noted examination findings, cardiology referral to be considered.  Strain of flexor muscle of right hip Patient with atraumatic right anterior hip pain with mild radiation into the proximal thigh ongoing for several weeks, has since resolved per patient but is concerned about the underlying cause.   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  Findings demonstrate negative straight leg raise, mild tightness throughout the posterior chain, FABER and FADIR negative, negative piriformis testing, resisted hip flexion demonstrates 4+/5 strength, this is associated with discomfort in the anterior hip, contralateral 5/5 strength and painless.  His clinical history and findings are most consistent with resolving hip flexor strain, most likely secondary to relative overuse coupled with deconditioning.  I have advised supportive care, dedicated home based exercises, and he can follow-up on as-needed basis for this issue.  Obesity, Class III, BMI 40-49.9 (morbid obesity) (HCC) Longstanding issue, ongoing for years, this is despite varying degrees of dietary and exercise interventions.  He has been on Ozempic through his previous primary care doctor for weight management, over the course of several months he has titrated to 1 mg/week, has been on this dose for a few months.  He tolerates the medication well without significant issue.  He has noted issues with continued weight loss which she attributes to diminished time for exercise and dietary indulgences.  We discussed his clinical concerns, the nature of the semaglutide for weight management, and he is amenable to a transition to [facility].  Additionally, we will titrate this dose to 1.7 mg after he has completed his current regimen of Ozempic 1 mg in 2 weeks time.  Return for follow-up in 6 weeks to reevaluate interval weight change and assess the need for further titration.  Chronic condition, uncontrolled, Rx management   Orders & Medications Meds ordered this encounter  Medications   WEGOVY 1.7 MG/0.75ML SOAJ  Sig: Inject 1.7 mg into the skin once a week. Use this dose for 1 month (4 shots) and then increase to next higher dose.    Dispense:  3 mL    Refill:  0   No orders of  the defined types were placed in this encounter.    Return in about 6 weeks (around 01/18/2022) for Annual physical.     Jason Banana, MD   Primary Care Sports Medicine [facility] [place]

## 2021-12-07 NOTE — Patient Instructions (Signed)
-   Finish remaining Ozempic 1 mg / week - After completion, start week Wegovy 1.7 mg / weekly x 4 weeks - Start 2 sprays in each nostril daily x 7 days then as-needed - Start home exercises x 2-4 weeks - Return in 6 weeks - Contact for any questions between now and then

## 2021-12-08 ENCOUNTER — Other Ambulatory Visit: Payer: Self-pay

## 2021-12-08 NOTE — Telephone Encounter (Signed)
PA submitted 12/07/21 for Wegovy 1.7 mg weekly (switching from Ozempic) though CoverMyMeds.  Pending response.  For your information.

## 2021-12-13 ENCOUNTER — Other Ambulatory Visit: Payer: Self-pay

## 2021-12-13 NOTE — Telephone Encounter (Signed)
PA approved for Wegovy 1.7 mg/0.75 mL from 12/12/21-03/12/22.  PA Reference #: [id number]

## 2021-12-14 ENCOUNTER — Other Ambulatory Visit: Payer: Self-pay

## 2021-12-22 ENCOUNTER — Other Ambulatory Visit: Payer: Self-pay

## 2022-01-25 ENCOUNTER — Telehealth: Payer: Self-pay | Admitting: Family Medicine

## 2022-01-25 ENCOUNTER — Other Ambulatory Visit: Payer: Self-pay

## 2022-01-25 ENCOUNTER — Encounter: Payer: 59 | Admitting: Family Medicine

## 2022-01-25 ENCOUNTER — Encounter: Payer: Self-pay | Admitting: Family Medicine

## 2022-01-25 DIAGNOSIS — Z7689 Persons encountering health services in other specified circumstances: Secondary | ICD-10-CM

## 2022-01-25 MED ORDER — PHENTERMINE HCL 15 MG PO CAPS
15.0000 mg | ORAL_CAPSULE | ORAL | 0 refills | Status: DC
  Filled 2022-01-25: qty 30, 30d supply, fill #0

## 2022-01-25 NOTE — Telephone Encounter (Signed)
? ? ?  Primary Care / Sports Medicine Telephone Note ? ?Patient Information:  ?Patient ID: Jason Wheeler, male DOB: 1996-06-03 Age: 26 y.o. MRN: [id number]  ? ?Patient contacted regarding issues with coverage of Wegovy, looking for more affordable options.  We discussed alternate therapies and he is amenable to trial of phentermine, 30-day supply sent at low-dose.  Additionally he is amenable to referral to nutritionist.  He was advised to contact his Strattera prescriber so that they are updated with this new medication, he will return for formal follow-up at annual physical 03/31/2022 timeframe. ? ?Jerrol Banana, MD ? ? Primary Care Sports Medicine ?[facility] [facility] [place]  ? ? ?

## 2022-01-25 NOTE — Progress Notes (Signed)
Erroneous

## 2022-03-09 ENCOUNTER — Encounter: Payer: Self-pay | Admitting: Family Medicine

## 2022-03-09 ENCOUNTER — Other Ambulatory Visit: Payer: Self-pay

## 2022-03-09 DIAGNOSIS — Z111 Encounter for screening for respiratory tuberculosis: Secondary | ICD-10-CM

## 2022-03-20 DIAGNOSIS — Z111 Encounter for screening for respiratory tuberculosis: Secondary | ICD-10-CM | POA: Diagnosis not present

## 2022-03-22 LAB — QUANTIFERON-TB GOLD PLUS
QuantiFERON Mitogen Value: >10 IU/mL
QuantiFERON Nil Value: 0.02 IU/mL
QuantiFERON TB1 Ag Value: 0.02 IU/mL
QuantiFERON TB2 Ag Value: 0.02 IU/mL
QuantiFERON-TB Gold Plus: NEGATIVE

## 2022-04-02 ENCOUNTER — Encounter: Payer: Self-pay | Admitting: Family Medicine

## 2022-04-02 ENCOUNTER — Ambulatory Visit (INDEPENDENT_AMBULATORY_CARE_PROVIDER_SITE_OTHER): Payer: 59 | Admitting: Family Medicine

## 2022-04-02 VITALS — BP 118/78 | HR 73 | Ht 66.0 in | Wt 258.8 lb

## 2022-04-02 DIAGNOSIS — E66813 Obesity, class 3: Secondary | ICD-10-CM

## 2022-04-02 DIAGNOSIS — Z6841 Body Mass Index (BMI) 40.0 and over, adult: Secondary | ICD-10-CM | POA: Diagnosis not present

## 2022-04-02 DIAGNOSIS — Z0000 Encounter for general adult medical examination without abnormal findings: Secondary | ICD-10-CM

## 2022-04-02 DIAGNOSIS — R7989 Other specified abnormal findings of blood chemistry: Secondary | ICD-10-CM

## 2022-04-02 DIAGNOSIS — G4733 Obstructive sleep apnea (adult) (pediatric): Secondary | ICD-10-CM | POA: Insufficient documentation

## 2022-04-02 DIAGNOSIS — Z13220 Encounter for screening for lipoid disorders: Secondary | ICD-10-CM | POA: Diagnosis not present

## 2022-04-02 DIAGNOSIS — Z1159 Encounter for screening for other viral diseases: Secondary | ICD-10-CM | POA: Diagnosis not present

## 2022-04-02 DIAGNOSIS — R400 Somnolence: Secondary | ICD-10-CM | POA: Diagnosis not present

## 2022-04-02 DIAGNOSIS — Z114 Encounter for screening for human immunodeficiency virus [HIV]: Secondary | ICD-10-CM

## 2022-04-02 NOTE — Assessment & Plan Note (Signed)
Patient did trial phentermine after he was unable to obtain insurance coverage for [facility].  Unfortunately, had GI related issues from the phentermine and self discontinued this.  He did not have an opportunity to establish with a nutritionist due to school/work related obligations.  I have encouraged lifestyle changes over the interim and we can attempt to prescribe Wegovy again in the future if covered.

## 2022-04-02 NOTE — Progress Notes (Signed)
     Primary Care / Sports Medicine Office Visit  Patient Information:  Patient ID: Jason Wheeler, male DOB: 01-02-1996 Age: 26 y.o. MRN: [id number]   Jason Wheeler is a pleasant 26 y.o. male presenting with the following:  Chief Complaint  Patient presents with   Annual Exam    No questions or concerns.     Vitals:   04/02/22 1044  BP: 118/78  Pulse: 73  SpO2: 98%   Vitals:   04/02/22 1044  Weight: 258 lb 12.8 oz (117.4 kg)  Height: 5\' 6"  (1.676 m)   Body mass index is 41.77 kg/m.  No results found.   Independent interpretation of notes and tests performed by another provider:   None  Procedures performed:   None  Pertinent History, Exam, Impression, and Recommendations:   Problem List Items Addressed This Visit       Other   Obesity    Patient did trial phentermine after he was unable to obtain insurance coverage for [facility].  Unfortunately, had GI related issues from the phentermine and self discontinued this.  He did not have an opportunity to establish with a nutritionist due to school/work related obligations.  I have encouraged lifestyle changes over the interim and we can attempt to prescribe Wegovy again in the future if covered.      Annual physical exam - Primary    Annual examination completed, risk stratification labs ordered, anticipatory guidance provided.  We will follow labs once resulted.      Relevant Orders   Apo A1 + B + Ratio   CBC   Comprehensive metabolic panel   Hepatitis C antibody   HIV Antibody (routine testing w rflx)   Lipid panel   TSH   VITAMIN D 25 Hydroxy (Vit-D Deficiency, Fractures)   Daytime somnolence    Discussed daytime somnolence and fatigue in the setting of reported occasional snoring by the patient's girlfriend.  Plan for home sleep study to further evaluate this issue.      Other Visit Diagnoses     Screening for HIV (human immunodeficiency virus)       Relevant Orders   HIV Antibody (routine  testing w rflx)   Need for hepatitis C screening test       Relevant Orders   Hepatitis C antibody   Screening for lipoid disorders       Relevant Orders   Apo A1 + B + Ratio   Lipid panel   Low serum vitamin D       Relevant Orders   VITAMIN D 25 Hydroxy (Vit-D Deficiency, Fractures)        Orders & Medications No orders of the defined types were placed in this encounter.  Orders Placed This Encounter  Procedures   Apo A1 + B + Ratio   CBC   Comprehensive metabolic panel   Hepatitis C antibody   HIV Antibody (routine testing w rflx)   Lipid panel   TSH   VITAMIN D 25 Hydroxy (Vit-D Deficiency, Fractures)     Return in about 1 year (around 04/03/2023) for Annual Physical.     06/03/2023, MD   Primary Care Sports Medicine [facility] [place]

## 2022-04-02 NOTE — Patient Instructions (Addendum)
-   Obtain fasting labs with orders provided (can have water or black coffee but otherwise no food or drink x 8 hours before labs) -Home sleep study will be mailed to your home, complete this and mail back, we will reach out with results - Review information provided - Attend eye doctor annually, dentist every 6 months, work towards or maintain 30 minutes of moderate intensity physical activity at least 5 days per week, and consume a balanced diet - Return in 1 year for physical - Contact us for any questions between now and then

## 2022-04-02 NOTE — Assessment & Plan Note (Signed)
Discussed daytime somnolence and fatigue in the setting of reported occasional snoring by the patient's girlfriend.  Plan for home sleep study to further evaluate this issue.

## 2022-04-02 NOTE — Assessment & Plan Note (Signed)
Annual examination completed, risk stratification labs ordered, anticipatory guidance provided.  We will follow labs once resulted. 

## 2022-04-10 DIAGNOSIS — Z13220 Encounter for screening for lipoid disorders: Secondary | ICD-10-CM | POA: Diagnosis not present

## 2022-04-10 DIAGNOSIS — Z1159 Encounter for screening for other viral diseases: Secondary | ICD-10-CM | POA: Diagnosis not present

## 2022-04-10 DIAGNOSIS — Z0000 Encounter for general adult medical examination without abnormal findings: Secondary | ICD-10-CM | POA: Diagnosis not present

## 2022-04-10 DIAGNOSIS — R7989 Other specified abnormal findings of blood chemistry: Secondary | ICD-10-CM | POA: Diagnosis not present

## 2022-04-10 DIAGNOSIS — Z114 Encounter for screening for human immunodeficiency virus [HIV]: Secondary | ICD-10-CM | POA: Diagnosis not present

## 2022-04-11 LAB — COMPREHENSIVE METABOLIC PANEL
ALT: 38 IU/L (ref 0–44)
AST: 28 IU/L (ref 0–40)
Albumin/Globulin Ratio: 1.6 (ref 1.2–2.2)
Albumin: 4.6 g/dL (ref 4.1–5.2)
Alkaline Phosphatase: 73 IU/L (ref 44–121)
BUN/Creatinine Ratio: 16 (ref 9–20)
BUN: 13 mg/dL (ref 6–20)
Bilirubin Total: 0.3 mg/dL (ref 0.0–1.2)
CO2: 24 mmol/L (ref 20–29)
Calcium: 9.7 mg/dL (ref 8.7–10.2)
Chloride: 100 mmol/L (ref 96–106)
Creatinine, Ser: 0.83 mg/dL (ref 0.76–1.27)
Globulin, Total: 2.8 g/dL (ref 1.5–4.5)
Glucose: 96 mg/dL (ref 70–99)
Potassium: 4.2 mmol/L (ref 3.5–5.2)
Sodium: 139 mmol/L (ref 134–144)
Total Protein: 7.4 g/dL (ref 6.0–8.5)
eGFR: 125 mL/min/{1.73_m2} (ref >59)

## 2022-04-11 LAB — LIPID PANEL
Chol/HDL Ratio: 6.8 ratio — ABNORMAL HIGH (ref 0.0–5.0)
Cholesterol, Total: 226 mg/dL — ABNORMAL HIGH (ref 100–199)
HDL: 33 mg/dL — ABNORMAL LOW (ref >39)
LDL Chol Calc (NIH): 150 mg/dL — ABNORMAL HIGH (ref 0–99)
Triglycerides: 234 mg/dL — ABNORMAL HIGH (ref 0–149)
VLDL Cholesterol Cal: 43 mg/dL — ABNORMAL HIGH (ref 5–40)

## 2022-04-11 LAB — HIV ANTIBODY (ROUTINE TESTING W REFLEX): HIV Screen 4th Generation wRfx: NONREACTIVE

## 2022-04-11 LAB — VITAMIN D 25 HYDROXY (VIT D DEFICIENCY, FRACTURES): Vit D, 25-Hydroxy: 26 ng/mL — ABNORMAL LOW (ref 30.0–100.0)

## 2022-04-11 LAB — CBC
Hematocrit: 42 % (ref 37.5–51.0)
Hemoglobin: 14.6 g/dL (ref 13.0–17.7)
MCH: 29.6 pg (ref 26.6–33.0)
MCHC: 34.8 g/dL (ref 31.5–35.7)
MCV: 85 fL (ref 79–97)
Platelets: 247 10*3/uL (ref 150–450)
RBC: 4.93 x10E6/uL (ref 4.14–5.80)
RDW: 12.6 % (ref 11.6–15.4)
WBC: 11.5 10*3/uL — ABNORMAL HIGH (ref 3.4–10.8)

## 2022-04-11 LAB — APO A1 + B + RATIO
Apolipo. B/A-1 Ratio: 1.1 ratio — ABNORMAL HIGH (ref 0.0–0.7)
Apolipoprotein A-1: 110 mg/dL (ref 101–178)
Apolipoprotein B: 122 mg/dL — ABNORMAL HIGH (ref <90)

## 2022-04-11 LAB — TSH: TSH: 2 u[IU]/mL (ref 0.450–4.500)

## 2022-04-11 LAB — HEPATITIS C ANTIBODY: Hep C Virus Ab: NONREACTIVE

## 2022-04-12 ENCOUNTER — Encounter: Payer: Self-pay | Admitting: Family Medicine

## 2022-04-16 ENCOUNTER — Other Ambulatory Visit: Payer: Self-pay | Admitting: Family Medicine

## 2022-04-16 ENCOUNTER — Other Ambulatory Visit: Payer: Self-pay

## 2022-04-16 ENCOUNTER — Ambulatory Visit: Payer: 59 | Admitting: Urology

## 2022-04-16 DIAGNOSIS — R7989 Other specified abnormal findings of blood chemistry: Secondary | ICD-10-CM

## 2022-04-16 MED ORDER — VITAMIN D (ERGOCALCIFEROL) 1.25 MG (50000 UNIT) PO CAPS
50000.0000 [IU] | ORAL_CAPSULE | ORAL | 0 refills | Status: DC
  Filled 2022-04-16: qty 8, 56d supply, fill #0

## 2022-04-25 DIAGNOSIS — G4733 Obstructive sleep apnea (adult) (pediatric): Secondary | ICD-10-CM | POA: Diagnosis not present

## 2022-05-14 ENCOUNTER — Encounter: Payer: Self-pay | Admitting: Family Medicine

## 2022-05-14 ENCOUNTER — Other Ambulatory Visit: Payer: Self-pay

## 2022-05-14 DIAGNOSIS — R400 Somnolence: Secondary | ICD-10-CM

## 2022-05-14 DIAGNOSIS — G4733 Obstructive sleep apnea (adult) (pediatric): Secondary | ICD-10-CM

## 2022-05-14 NOTE — Telephone Encounter (Signed)
Referral placed.

## 2022-05-14 NOTE — Telephone Encounter (Signed)
Please advise 

## 2022-05-17 ENCOUNTER — Encounter: Payer: 59 | Attending: Family Medicine | Admitting: Dietician

## 2022-05-17 ENCOUNTER — Encounter: Payer: Self-pay | Admitting: Dietician

## 2022-05-17 VITALS — Ht 66.0 in | Wt 253.5 lb

## 2022-05-17 DIAGNOSIS — H52222 Regular astigmatism, left eye: Secondary | ICD-10-CM | POA: Diagnosis not present

## 2022-05-17 NOTE — Patient Instructions (Signed)
Continue to control food portions by incorporating low carb vegetables, using appropriate sized containers, keeping starches to fist-size most of the time, and meats to palm-size portions most meals. Great job including some workouts, keep it up! Plan to have a vegetable or fruit, or both, with each meal, and with some snacks.  Try meal planning/ recipe idea app such as Yummly if needed for additional ideas.

## 2022-05-17 NOTE — Progress Notes (Signed)
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 Weight was in 230's but now unable to get much below 250.   Food allergies: none Special diet practices: none Patient seeks help with knowing more options for balanced meals    Dietary Intake:  Usual eating pattern includes 2-3 meals and 1-2 snacks per day. Dining out frequency: 2-3 meals per week. Who plans meals/ buys groceries? self Who prepares meals? Self                       Breakfast: grits with cheese and bacon bits + fairlife protein drink;  Snack: none Lunch: sometimes skips due to sleeping after work; sometimes rice and chicken; refried beans/  Snack: none or same as evening Supper: same as lunch; often chicken Snack: popcorn; belvita breakfast biscuit Beverages: water, some with lemon; propel water; soda maybe 1-2x a week  Physical activity: cardio and weights 45-60 minutes, 2-3 times a week   Intervention:   Nutrition Care Education:   Basic nutrition: basic food groups; appropriate nutrient balance; appropriate meal and snack schedule; general nutrition guidelines    Weight control: importance of low sugar and low fat choices; portion control strategies; estimated energy needs for weight loss at 1800-2000 kcal, provided guidance for 45% CHO, 25% pro, 30% fat; discussed role of physical activity; managing stress eating Advanced nutrition: meal options for quick, simple cooking; resources for meal ideas   Other intervention notes: Patient has  good basic nutrition knowledge. He has been working on diet and lifestyle changes and is motivated to continue. Work and school schedule make meal planning and healthy eating more of a challenge. Established goals for additional change with direction from patient. No follow up scheduled at this time, patient will schedule later if needed.   Nutritional Diagnosis:  Holly-3.3 Overweight/obesity As related to history of excess calories and inadequate physical activity.  As evidenced by current BMI of 40, with patient working in lifestyle changes to promote weight loss.   Education Materials given:  Designer, industrial/product with food lists, sample meal pattern Sample menus Build a [employer] Visit summary with goals/ instructions   Learner/ who was taught:  Patient   Level of understanding: Verbalizes/ demonstrates competency  Demonstrated degree of understanding via:   Teach back Learning barriers: None  Willingness to learn/ readiness for change: Eager, change in progress  Monitoring and Evaluation:  Dietary intake, exercise, and body weight      follow up: prn

## 2022-05-21 ENCOUNTER — Other Ambulatory Visit (HOSPITAL_COMMUNITY): Payer: Self-pay

## 2022-05-30 ENCOUNTER — Other Ambulatory Visit (HOSPITAL_COMMUNITY): Payer: Self-pay

## 2022-07-30 ENCOUNTER — Encounter: Payer: Self-pay | Admitting: Family Medicine

## 2022-10-23 ENCOUNTER — Encounter: Payer: Self-pay | Admitting: Family Medicine

## 2022-10-23 ENCOUNTER — Other Ambulatory Visit: Payer: Self-pay | Admitting: Family Medicine

## 2022-10-23 ENCOUNTER — Other Ambulatory Visit: Payer: Self-pay

## 2022-10-23 DIAGNOSIS — G4733 Obstructive sleep apnea (adult) (pediatric): Secondary | ICD-10-CM

## 2022-10-23 DIAGNOSIS — Z7689 Persons encountering health services in other specified circumstances: Secondary | ICD-10-CM

## 2022-10-23 MED ORDER — SEMAGLUTIDE-WEIGHT MANAGEMENT 0.25 MG/0.5ML ~~LOC~~ SOAJ
0.2500 mg | SUBCUTANEOUS | 0 refills | Status: DC
  Filled 2022-10-23: qty 2, fill #0
  Filled 2022-10-23 (×2): qty 2, 28d supply, fill #0
  Filled 2022-10-25: qty 2, fill #0
  Filled 2022-10-30: qty 2, 28d supply, fill #0

## 2022-10-23 NOTE — Telephone Encounter (Signed)
Please review.  KP

## 2022-10-25 ENCOUNTER — Other Ambulatory Visit: Payer: Self-pay

## 2022-10-25 ENCOUNTER — Telehealth: Payer: Self-pay | Admitting: Family Medicine

## 2022-10-25 NOTE — Telephone Encounter (Signed)
Copied from [place] [id number]. Topic: General - Other >> Oct 25, 2022 10:54 AM Cyndi Bender wrote: Reason for CRM: Pt stated a prior authorization is needed for Semaglutide-Weight Management 0.25 MG/0.5ML SOAJ

## 2022-10-25 NOTE — Telephone Encounter (Signed)
Waiting on pt to submit new insurance card

## 2022-10-30 ENCOUNTER — Other Ambulatory Visit: Payer: Self-pay

## 2022-11-01 ENCOUNTER — Other Ambulatory Visit: Payer: Self-pay

## 2022-11-05 NOTE — Telephone Encounter (Signed)
Pt is calling to report that he received denial from [employer]. [employer] stated that the patient did not meet the qualifications. Pt would like to know what the next steps. CB- [phone]

## 2022-11-12 ENCOUNTER — Ambulatory Visit (INDEPENDENT_AMBULATORY_CARE_PROVIDER_SITE_OTHER): Payer: 59 | Admitting: Family Medicine

## 2022-11-12 ENCOUNTER — Other Ambulatory Visit: Payer: Self-pay

## 2022-11-12 ENCOUNTER — Encounter: Payer: Self-pay | Admitting: Family Medicine

## 2022-11-12 VITALS — BP 124/70 | HR 68 | Temp 98.6°F | Ht 66.0 in | Wt 260.0 lb

## 2022-11-12 DIAGNOSIS — G4733 Obstructive sleep apnea (adult) (pediatric): Secondary | ICD-10-CM

## 2022-11-12 DIAGNOSIS — Z7689 Persons encountering health services in other specified circumstances: Secondary | ICD-10-CM

## 2022-11-12 DIAGNOSIS — H6691 Otitis media, unspecified, right ear: Secondary | ICD-10-CM

## 2022-11-12 MED ORDER — AMOXICILLIN-POT CLAVULANATE 875-125 MG PO TABS
1.0000 | ORAL_TABLET | Freq: Two times a day (BID) | ORAL | 0 refills | Status: DC
  Filled 2022-11-12: qty 20, 10d supply, fill #0

## 2022-11-12 MED ORDER — PROMETHAZINE-DM 6.25-15 MG/5ML PO SYRP
5.0000 mL | ORAL_SOLUTION | Freq: Four times a day (QID) | ORAL | 0 refills | Status: DC | PRN
  Filled 2022-11-12: qty 118, 6d supply, fill #0

## 2022-11-12 MED ORDER — PHENTERMINE HCL 15 MG PO CAPS
15.0000 mg | ORAL_CAPSULE | ORAL | 0 refills | Status: DC
  Filled 2022-11-12: qty 30, 30d supply, fill #0

## 2022-11-12 MED ORDER — TOPIRAMATE 50 MG PO TABS
50.0000 mg | ORAL_TABLET | Freq: Every day | ORAL | 0 refills | Status: DC
  Filled 2022-11-12: qty 30, 30d supply, fill #0

## 2022-11-12 NOTE — Patient Instructions (Signed)
-  Take antibiotics for full course - Can use cough syrup as needed - Recommend adjunct Flonase, 2 sprays each nostril daily while on antibiotics and Mucinex twice daily while on antibiotics - Sleep medicine group will contact you to coordinate CPAP - After 1 week can start phentermine and topiramate -Continue healthy lifestyle changes as you have been doing - Return follow-up in 5 weeks

## 2022-11-12 NOTE — Assessment & Plan Note (Signed)
5-day history of initial congestion, dry cough, myalgias, progressed to mildly productive cough and right ear fullness discomfort and decreased hearing.  Denies any fever/chills.  Examination with clear lung fields throughout, benign cardiac sounds, oropharynx with mild erythema, no exudates, nasopharynx with mild erythema, nonswollen turbinates, nontender sinuses, left tympanic membrane canal benign, right tympanic membrane bulging, erythematous, purulent, canal benign.  Tender right auricular lymphadenopathy.  Clinical history and findings most consistent with right otitis media, most likely secondary/superimposed in setting of initial viral URI.  Plan for Augmentin course, supportive care including Phenergan DM and OTC medications.  Can follow-up as needed for this issue.

## 2022-11-12 NOTE — Assessment & Plan Note (Signed)
Previously diagnosed with home sleep study, discussed scores, risk versus benefits, and given comorbid medical conditions patient is amenable to CPAP, order faxed.

## 2022-11-12 NOTE — Progress Notes (Signed)
Primary Care / Sports Medicine Office Visit  Patient Information:  Patient ID: Jason Wheeler, male DOB: 02/13/1996 Age: 27 y.o. MRN: [id number]   Jason Wheeler is a pleasant 27 y.o. male presenting with the following:  Chief Complaint  Patient presents with   Sore Throat    Cough, for 4 days, right ear fullness feeling    Vitals:   11/12/22 1559  BP: 124/70  Pulse: 68  Temp: 98.6 F (37 C)  SpO2: 98%   Vitals:   11/12/22 1559  Weight: 260 lb (117.9 kg)  Height: 5\' 6"  (1.676 m)   Body mass index is 41.97 kg/m.  No results found.   Independent interpretation of notes and tests performed by another provider:   None  Procedures performed:   None  Pertinent History, Exam, Impression, and Recommendations:   [name] was seen today for sore throat.  Right otitis media, unspecified otitis media type Assessment & Plan: 5-day history of initial congestion, dry cough, myalgias, progressed to mildly productive cough and right ear fullness discomfort and decreased hearing.  Denies any fever/chills.  Examination with clear lung fields throughout, benign cardiac sounds, oropharynx with mild erythema, no exudates, nasopharynx with mild erythema, nonswollen turbinates, nontender sinuses, left tympanic membrane canal benign, right tympanic membrane bulging, erythematous, purulent, canal benign.  Tender right auricular lymphadenopathy.  Clinical history and findings most consistent with right otitis media, most likely secondary/superimposed in setting of initial viral URI.  Plan for Augmentin course, supportive care including Phenergan DM and OTC medications.  Can follow-up as needed for this issue.  Orders: -     Amoxicillin-Pot Clavulanate; Take 1 tablet by mouth 2 (two) times daily.  Dispense: 20 tablet; Refill: 0 -     Promethazine-DM; Take 5 mLs by mouth 4 (four) times daily as needed for cough.  Dispense: 118 mL; Refill: 0  Encounter for weight  management Assessment & Plan: Chronic condition with ongoing symptomatology despite patient's work towards aggressive lifestyle changes (calorie restrictions, increased physical activity).  We reviewed various pharmacotherapeutic options and will initiate phentermine 15 mg and topiramate 50 mg daily (after antibiotic regimen) and coordinate follow-up in 5 weeks for weight check and possible titration pending tolerability.  I have encouraged him to continue with lifestyle modifications and treat comorbid obstructive sleep apnea for further optimization of this issue. See additional assessment(s) for plan details.  Orders: -     Phentermine HCl; Take 1 capsule (15 mg total) by mouth every morning.  Dispense: 30 capsule; Refill: 0 -     Topiramate; Take 1 tablet (50 mg total) by mouth daily.  Dispense: 30 tablet; Refill: 0  Obstructive sleep apnea Assessment & Plan: Previously diagnosed with home sleep study, discussed scores, risk versus benefits, and given comorbid medical conditions patient is amenable to CPAP, order faxed.      Orders & Medications Meds ordered this encounter  Medications   amoxicillin-clavulanate (AUGMENTIN) 875-125 MG tablet    Sig: Take 1 tablet by mouth 2 (two) times daily.    Dispense:  20 tablet    Refill:  0   promethazine-dextromethorphan (PROMETHAZINE-DM) 6.25-15 MG/5ML syrup    Sig: Take 5 mLs by mouth 4 (four) times daily as needed for cough.    Dispense:  118 mL    Refill:  0   phentermine 15 MG capsule    Sig: Take 1 capsule (15 mg total) by mouth every morning.    Dispense:  30 capsule    Refill:  0   topiramate (TOPAMAX) 50 MG tablet    Sig: Take 1 tablet (50 mg total) by mouth daily.    Dispense:  30 tablet    Refill:  0   No orders of the defined types were placed in this encounter.    Return in about 5 weeks (around 12/17/2022).     [name], MD, [facility]   Primary Care Sports Medicine Primary Care and Sports Medicine at [facility]

## 2022-11-12 NOTE — Assessment & Plan Note (Signed)
Chronic condition with ongoing symptomatology despite patient's work towards aggressive lifestyle changes (calorie restrictions, increased physical activity).  We reviewed various pharmacotherapeutic options and will initiate phentermine 15 mg and topiramate 50 mg daily (after antibiotic regimen) and coordinate follow-up in 5 weeks for weight check and possible titration pending tolerability.  I have encouraged him to continue with lifestyle modifications and treat comorbid obstructive sleep apnea for further optimization of this issue. See additional assessment(s) for plan details.

## 2022-11-26 ENCOUNTER — Ambulatory Visit: Payer: Commercial Managed Care - PPO | Admitting: Family Medicine

## 2022-12-06 ENCOUNTER — Encounter: Payer: Self-pay | Admitting: *Deleted

## 2022-12-06 ENCOUNTER — Ambulatory Visit: Payer: 59 | Admitting: Urology

## 2022-12-06 ENCOUNTER — Other Ambulatory Visit: Payer: Self-pay | Admitting: *Deleted

## 2022-12-06 DIAGNOSIS — N200 Calculus of kidney: Secondary | ICD-10-CM

## 2022-12-06 DIAGNOSIS — G4733 Obstructive sleep apnea (adult) (pediatric): Secondary | ICD-10-CM | POA: Diagnosis not present

## 2022-12-11 ENCOUNTER — Other Ambulatory Visit: Payer: Self-pay

## 2022-12-17 ENCOUNTER — Encounter: Payer: Self-pay | Admitting: Family Medicine

## 2022-12-17 ENCOUNTER — Ambulatory Visit (INDEPENDENT_AMBULATORY_CARE_PROVIDER_SITE_OTHER): Payer: 59 | Admitting: Family Medicine

## 2022-12-17 ENCOUNTER — Other Ambulatory Visit: Payer: Self-pay

## 2022-12-17 VITALS — BP 128/70 | HR 88 | Ht 66.0 in | Wt 252.0 lb

## 2022-12-17 DIAGNOSIS — G4733 Obstructive sleep apnea (adult) (pediatric): Secondary | ICD-10-CM | POA: Diagnosis not present

## 2022-12-17 DIAGNOSIS — Z7689 Persons encountering health services in other specified circumstances: Secondary | ICD-10-CM

## 2022-12-17 MED ORDER — TOPIRAMATE 50 MG PO TABS
50.0000 mg | ORAL_TABLET | Freq: Every day | ORAL | 0 refills | Status: DC
  Filled 2022-12-17: qty 30, 30d supply, fill #0

## 2022-12-17 MED ORDER — PHENTERMINE HCL 30 MG PO CAPS
30.0000 mg | ORAL_CAPSULE | ORAL | 0 refills | Status: DC
  Filled 2022-12-17: qty 30, 30d supply, fill #0

## 2022-12-20 ENCOUNTER — Other Ambulatory Visit: Payer: 59

## 2022-12-20 DIAGNOSIS — N200 Calculus of kidney: Secondary | ICD-10-CM | POA: Diagnosis not present

## 2022-12-20 NOTE — Patient Instructions (Signed)
-   Take new phentermine 30 mg dose - Continue topiramate - Touch base with CPAP group regarding nasal pillow mask option - Continue healthy lifestyle changes as you have been doing - Return for follow-up in 4 weeks

## 2022-12-20 NOTE — Assessment & Plan Note (Signed)
Tolerating phentermine 15 mg and topiramate 50 mg well without issue, significant interim weight loss, no adverse effects reported from a cardiac, psychiatric, gastric transitional, sleep standpoint.  As such, we will further titrate and coordinate follow-up in 1 month for reevaluation.

## 2022-12-20 NOTE — Assessment & Plan Note (Signed)
Has been compliant with CPAP, discussed possible transition to nasal pillow, some endorsed improvement.

## 2022-12-20 NOTE — Progress Notes (Signed)
     Primary Care / Sports Medicine Office Visit  Patient Information:  Patient ID: Celio Hendriks, male DOB: 06-19-1996 Age: 27 y.o. MRN: [id number]   Kelven Deets is a pleasant 27 y.o. male presenting with the following:  Chief Complaint  Patient presents with   Weight Check    Vitals:   12/17/22 1546  BP: 128/70  Pulse: 88  SpO2: 98%   Vitals:   12/17/22 1546  Weight: 252 lb (114.3 kg)  Height: 5' 6"$  (1.676 m)   Body mass index is 40.67 kg/m.  No results found.   Independent interpretation of notes and tests performed by another provider:   None  Procedures performed:   None  Pertinent History, Exam, Impression, and Recommendations:   Ekene was seen today for weight check.  Encounter for weight management Assessment & Plan: Tolerating phentermine 15 mg and topiramate 50 mg well without issue, significant interim weight loss, no adverse effects reported from a cardiac, psychiatric, gastric transitional, sleep standpoint.  As such, we will further titrate and coordinate follow-up in 1 month for reevaluation.  Orders: -     Topiramate; Take 1 tablet (50 mg total) by mouth daily.  Dispense: 30 tablet; Refill: 0 -     Phentermine HCl; Take 1 capsule (30 mg total) by mouth every morning.  Dispense: 30 capsule; Refill: 0  Obstructive sleep apnea Assessment & Plan: Has been compliant with CPAP, discussed possible transition to nasal pillow, some endorsed improvement.      Orders & Medications Meds ordered this encounter  Medications   topiramate (TOPAMAX) 50 MG tablet    Sig: Take 1 tablet (50 mg total) by mouth daily.    Dispense:  30 tablet    Refill:  0   phentermine 30 MG capsule    Sig: Take 1 capsule (30 mg total) by mouth every morning.    Dispense:  30 capsule    Refill:  0   No orders of the defined types were placed in this encounter.    Return in about 4 weeks (around 01/14/2023).     [name], MD, [facility]   Primary  Care Sports Medicine Primary Care and Sports Medicine at [facility]

## 2022-12-21 LAB — LITHOLINK SERUM PANEL
CO2: 25 mmol/L (ref 20–29)
Calcium: 10.8 mg/dL — ABNORMAL HIGH (ref 8.7–10.2)
Chloride: 98 mmol/L (ref 96–106)
Creatinine, Ser: 1.01 mg/dL (ref 0.76–1.27)
Magnesium: 2 mg/dL (ref 1.6–2.3)
Phosphorus: 2.8 mg/dL (ref 2.8–4.1)
Potassium: 3.7 mmol/L (ref 3.5–5.2)
Sodium: 140 mmol/L (ref 134–144)
Uric Acid: 8.6 mg/dL — ABNORMAL HIGH (ref 3.8–8.4)
eGFR: 105 mL/min/{1.73_m2} (ref >59)

## 2022-12-27 LAB — LITHOLINK 24HR URINE PANEL

## 2023-01-02 ENCOUNTER — Telehealth: Payer: Self-pay | Admitting: Family Medicine

## 2023-01-02 NOTE — Telephone Encounter (Signed)
Copied from [place] [id number]. Topic: General - Inquiry >> Jan 02, 2023  1:11 PM Jason Wheeler wrote: Reason for CRM: Pt stated he is sending an application for state board testing and has a diagnosis of anxiety and ADHD. However, anxiety was diagnosed by a Education officer, museum, and it may not be accepted. He stated that he does not want to lose his accommodations and is asking if Dr.Matthews can write a letter with diagnoses on the letterhead so that he can send it in with his application.  He mentioned that he has documentation that he can provide to Dr.Matthews that a social worker diagnosed him at a previous school. But isnt sure if they will accept this from a Education officer, museum.  Please advise.

## 2023-01-03 NOTE — Telephone Encounter (Signed)
Please advise 

## 2023-01-04 DIAGNOSIS — G4733 Obstructive sleep apnea (adult) (pediatric): Secondary | ICD-10-CM | POA: Diagnosis not present

## 2023-01-07 NOTE — Telephone Encounter (Signed)
Note sent

## 2023-01-07 NOTE — Telephone Encounter (Signed)
Patient called in stating he can't use just the dx from the social worker he had to have a leter from Dr Zigmund Daniel sent directly to him so he can send it to the nursing board

## 2023-01-16 ENCOUNTER — Ambulatory Visit: Payer: 59 | Admitting: Urology

## 2023-01-16 ENCOUNTER — Other Ambulatory Visit: Payer: Self-pay | Admitting: Family Medicine

## 2023-01-16 NOTE — Telephone Encounter (Signed)
Refill

## 2023-01-16 NOTE — Telephone Encounter (Signed)
Spoke with patient and he does not want to repeat the [place]. Pt states [facility] was rude and he did collect the urine in the correct time. Pt states he does not want to pay for another test. Pt states he is not having any problems with kidney stones. Pt asking if he can stay on potassium and indapamide?

## 2023-01-16 NOTE — Telephone Encounter (Signed)
I Read the [facility] message to pt.  He still needs clarification regarding the [place], still .  Please call Pt [phone]

## 2023-01-28 ENCOUNTER — Other Ambulatory Visit: Payer: Self-pay | Admitting: Family Medicine

## 2023-01-28 ENCOUNTER — Other Ambulatory Visit: Payer: Self-pay

## 2023-01-28 DIAGNOSIS — R82994 Hypercalciuria: Secondary | ICD-10-CM

## 2023-01-28 DIAGNOSIS — N200 Calculus of kidney: Secondary | ICD-10-CM

## 2023-01-28 DIAGNOSIS — Z7689 Persons encountering health services in other specified circumstances: Secondary | ICD-10-CM

## 2023-01-28 MED ORDER — INDAPAMIDE 2.5 MG PO TABS
2.5000 mg | ORAL_TABLET | Freq: Every day | ORAL | 11 refills | Status: AC
  Filled 2023-01-28: qty 30, 30d supply, fill #0
  Filled 2023-04-21: qty 30, 30d supply, fill #1
  Filled 2023-06-03: qty 30, 30d supply, fill #2
  Filled 2023-08-12: qty 30, 30d supply, fill #3
  Filled 2023-09-29: qty 30, 30d supply, fill #4
  Filled 2023-12-19: qty 30, 30d supply, fill #5

## 2023-01-29 ENCOUNTER — Other Ambulatory Visit: Payer: Self-pay

## 2023-01-29 MED FILL — Topiramate Tab 50 MG: ORAL | 90 days supply | Qty: 90 | Fill #0 | Status: AC

## 2023-01-29 NOTE — Telephone Encounter (Signed)
Requested medication (s) are due for refill today: yes  Requested medication (s) are on the active medication list: yes  Last refill:  12/17/22 #30/0  Future visit scheduled: no  Notes to clinic:  Unable to refill per protocol, cannot delegate.    Requested Prescriptions  Pending Prescriptions Disp Refills   phentermine 30 MG capsule 30 capsule 0    Sig: Take 1 capsule (30 mg total) by mouth every morning.     Not Delegated - Neurology: Anticonvulsants - Controlled - phentermine hydrochloride Failed - 01/28/2023  5:52 PM      Failed - This refill cannot be delegated      Passed - eGFR in normal range and within 360 days    GFR calc Af Amer  Date Value Ref Range Status  04/28/2018 >60 >60 mL/min Final    Comment:    (NOTE) The eGFR has been calculated using the CKD EPI equation. This calculation has not been validated in all clinical situations. eGFR's persistently <60 mL/min signify possible Chronic Kidney Disease.    GFR, Estimated  Date Value Ref Range Status  03/02/2021 >60 >60 mL/min Final    Comment:    (NOTE) Calculated using the CKD-EPI Creatinine Equation (2021)    GFR  Date Value Ref Range Status  03/30/2021 120.55 >60.00 mL/min Final    Comment:    Calculated using the CKD-EPI Creatinine Equation (2021)   eGFR  Date Value Ref Range Status  12/20/2022 105 >59 mL/min/1.73 Final         Passed - Cr in normal range and within 360 days    Creatinine, Ser  Date Value Ref Range Status  12/20/2022 1.01 0.76 - 1.27 mg/dL Final         Passed - Last BP in normal range    BP Readings from Last 1 Encounters:  12/17/22 128/70         Passed - Valid encounter within last 6 months    Recent Outpatient Visits           1 month ago Encounter for weight management   [place] [facility], Ocie Bob, MD   2 months ago Right otitis media, unspecified otitis media type   [facility]  at [facility], Ocie Bob, MD   10 months ago Annual physical exam   [facility] at [facility], Ocie Bob, MD   1 year ago Allergic rhinitis, unspecified seasonality, unspecified trigger   [place] [facility] at [facility], Ocie Bob, MD              Passed - Weight completed in the last 3 months    Wt Readings from Last 1 Encounters:  12/17/22 252 lb (114.3 kg)         Signed Prescriptions Disp Refills   topiramate (TOPAMAX) 50 MG tablet 90 tablet 0    Sig: Take 1 tablet (50 mg total) by mouth daily.     Neurology: Anticonvulsants - topiramate & zonisamide Passed - 01/28/2023  5:52 PM      Passed - Cr in normal range and within 360 days    Creatinine, Ser  Date Value Ref Range Status  12/20/2022 1.01 0.76 - 1.27 mg/dL Final         Passed - CO2 in normal range and within 360 days    CO2  Date Value Ref Range Status  12/20/2022 25 20 - 29 mmol/L Final         Passed - ALT in normal range and within 360 days    ALT  Date Value Ref Range Status  04/10/2022 38 0 - 44 IU/L Final         Passed - AST in normal range and within 360 days    AST  Date Value Ref Range Status  04/10/2022 28 0 - 40 IU/L Final         Passed - Completed PHQ-2 or PHQ-9 in the last 360 days      Passed - Valid encounter within last 12 months    Recent Outpatient Visits           1 month ago Encounter for weight management   [place] [facility] at [facility], Ocie Bob, MD   2 months ago Right otitis media, unspecified otitis media type   [facility] at [facility], Ocie Bob, MD   10 months ago Annual physical exam   [facility] Sports Medicine at [facility], Ocie Bob, MD   1 year ago Allergic rhinitis, unspecified seasonality, unspecified trigger   [place] [facility]  at [facility], Ocie Bob, MD

## 2023-01-29 NOTE — Telephone Encounter (Signed)
Requested Prescriptions  Pending Prescriptions Disp Refills   phentermine 30 MG capsule 30 capsule 0    Sig: Take 1 capsule (30 mg total) by mouth every morning.     Not Delegated - Neurology: Anticonvulsants - Controlled - phentermine hydrochloride Failed - 01/28/2023  5:52 PM      Failed - This refill cannot be delegated      Passed - eGFR in normal range and within 360 days    GFR calc Af Amer  Date Value Ref Range Status  04/28/2018 >60 >60 mL/min Final    Comment:    (NOTE) The eGFR has been calculated using the CKD EPI equation. This calculation has not been validated in all clinical situations. eGFR's persistently <60 mL/min signify possible Chronic Kidney Disease.    GFR, Estimated  Date Value Ref Range Status  03/02/2021 >60 >60 mL/min Final    Comment:    (NOTE) Calculated using the CKD-EPI Creatinine Equation (2021)    GFR  Date Value Ref Range Status  03/30/2021 120.55 >60.00 mL/min Final    Comment:    Calculated using the CKD-EPI Creatinine Equation (2021)   eGFR  Date Value Ref Range Status  12/20/2022 105 >59 mL/min/1.73 Final         Passed - Cr in normal range and within 360 days    Creatinine, Ser  Date Value Ref Range Status  12/20/2022 1.01 0.76 - 1.27 mg/dL Final         Passed - Last BP in normal range    BP Readings from Last 1 Encounters:  12/17/22 128/70         Passed - Valid encounter within last 6 months    Recent Outpatient Visits           1 month ago Encounter for weight management   [place] [facility] at [facility], Ocie Bob, MD   2 months ago Right otitis media, unspecified otitis media type   [facility] at [facility], Ocie Bob, MD   10 months ago Annual physical exam   [facility] at [facility], Ocie Bob, MD   1 year ago Allergic rhinitis, unspecified seasonality, unspecified trigger   [facility] at [facility], Ocie Bob, MD              Passed - Weight completed in the last 3 months    Wt Readings from Last 1 Encounters:  12/17/22 252 lb (114.3 kg)          topiramate (TOPAMAX) 50 MG tablet 90 tablet 0    Sig: Take 1 tablet (50 mg total) by mouth daily.     Neurology: Anticonvulsants - topiramate & zonisamide Passed - 01/28/2023  5:52 PM      Passed - Cr in normal range and within 360 days    Creatinine, Ser  Date Value Ref Range Status  12/20/2022 1.01 0.76 - 1.27 mg/dL Final         Passed - CO2 in normal range and within 360 days    CO2  Date Value Ref Range Status  12/20/2022 25 20 - 29 mmol/L Final         Passed - ALT in normal range and within 360 days    ALT  Date Value Ref Range Status  04/10/2022 38 0 - 44 IU/L Final  Passed - AST in normal range and within 360 days    AST  Date Value Ref Range Status  04/10/2022 28 0 - 40 IU/L Final         Passed - Completed PHQ-2 or PHQ-9 in the last 360 days      Passed - Valid encounter within last 12 months    Recent Outpatient Visits           1 month ago Encounter for weight management   [place] [facility] at [facility], Ocie Bob, MD   2 months ago Right otitis media, unspecified otitis media type   [facility] Primary Care & [facility] Medicine at [facility], Ocie Bob, MD   10 months ago Annual physical exam   [facility] at [facility], Ocie Bob, MD   1 year ago Allergic rhinitis, unspecified seasonality, unspecified trigger   [place] [facility] Medicine at [facility], Ocie Bob, MD

## 2023-01-30 ENCOUNTER — Other Ambulatory Visit: Payer: Self-pay

## 2023-01-30 NOTE — Telephone Encounter (Signed)
Please review.  KP

## 2023-02-01 ENCOUNTER — Other Ambulatory Visit: Payer: Self-pay

## 2023-02-04 DIAGNOSIS — G4733 Obstructive sleep apnea (adult) (pediatric): Secondary | ICD-10-CM | POA: Diagnosis not present

## 2023-02-06 ENCOUNTER — Encounter: Payer: Self-pay | Admitting: Family Medicine

## 2023-02-07 NOTE — Telephone Encounter (Signed)
Please review.  KP

## 2023-02-12 ENCOUNTER — Other Ambulatory Visit: Payer: Self-pay

## 2023-02-12 ENCOUNTER — Ambulatory Visit (INDEPENDENT_AMBULATORY_CARE_PROVIDER_SITE_OTHER): Payer: 59 | Admitting: Family Medicine

## 2023-02-12 ENCOUNTER — Encounter: Payer: Self-pay | Admitting: Family Medicine

## 2023-02-12 VITALS — BP 120/76 | HR 94 | Ht 66.0 in | Wt 233.0 lb

## 2023-02-12 DIAGNOSIS — Z7689 Persons encountering health services in other specified circumstances: Secondary | ICD-10-CM

## 2023-02-12 MED ORDER — PHENTERMINE HCL 30 MG PO CAPS
30.0000 mg | ORAL_CAPSULE | ORAL | 0 refills | Status: DC
Start: 2023-02-12 — End: 2023-03-20
  Filled 2023-02-12: qty 30, 30d supply, fill #0

## 2023-02-12 NOTE — Assessment & Plan Note (Signed)
Continues to demonstrate significant interval weight reduction, has been making lifestyle changes.  Some lightheadedness when going from seated to standing positions and dry mouth reported.  He is on a diuretic through urology due to renal stones.  He will touch base with them in the future about medication modification if needed.  - Remain on current phentermine 30 mg dose to limit adverse effect profile, can revisit at his return or sooner at patient request - Continue current topiramate 50 mg daily - Return in 3 months, can contact us monthly for interim refills

## 2023-02-12 NOTE — Progress Notes (Signed)
     Primary Care / Sports Medicine Office Visit  Patient Information:  Patient ID: Jason Wheeler, male DOB: [birth year] Age: 27 y.o. MRN: [id number]   Jason Wheeler is a pleasant 27 y.o. male presenting with the following:  Chief Complaint  Patient presents with   Weight Check    Vitals:   02/12/23 1033  BP: 120/76  Pulse: 94  SpO2: 99%   Vitals:   02/12/23 1033  Weight: 233 lb (105.7 kg)  Height:  (1.676 m)   Body mass index is 37.61 kg/m.  No results found.   Independent interpretation of notes and tests performed by another provider:   None  Procedures performed:   None  Pertinent History, Exam, Impression, and Recommendations:   Watt was seen today for weight check.  Encounter for weight management Assessment & Plan: Continues to demonstrate significant interval weight reduction, has been making lifestyle changes.  Some lightheadedness when going from seated to standing positions and dry mouth reported.  He is on a diuretic through urology due to renal stones.  He will touch base with them in the future about medication modification if needed.  - Remain on current phentermine 30 mg dose to limit adverse effect profile, can revisit at his return or sooner at patient request - Continue current topiramate 50 mg daily - Return in 3 months, can contact us monthly for interim refills  Orders: -     Phentermine HCl; Take 1 capsule (30 mg total) by mouth every morning.  Dispense: 30 capsule; Refill: 0     Orders & Medications Meds ordered this encounter  Medications   phentermine 30 MG capsule    Sig: Take 1 capsule (30 mg total) by mouth every morning.    Dispense:  30 capsule    Refill:  0   No orders of the defined types were placed in this encounter.    Return in about 3 months (around 05/14/2023).     Jerrol Banana, MD, [facility]   Primary Care Sports Medicine Primary Care and Sports Medicine at [facility]

## 2023-02-12 NOTE — Patient Instructions (Signed)
-   Continue current phentermine 30 mg and topiramate 50 mg daily - Continue healthy lifestyle changes as you have been doing - Can contact us monthly for refills - Return for follow-up in 3 months

## 2023-03-06 DIAGNOSIS — G4733 Obstructive sleep apnea (adult) (pediatric): Secondary | ICD-10-CM | POA: Diagnosis not present

## 2023-03-19 ENCOUNTER — Other Ambulatory Visit: Payer: Self-pay | Admitting: Family Medicine

## 2023-03-19 DIAGNOSIS — Z7689 Persons encountering health services in other specified circumstances: Secondary | ICD-10-CM

## 2023-03-20 ENCOUNTER — Other Ambulatory Visit: Payer: Self-pay | Admitting: Family Medicine

## 2023-03-20 ENCOUNTER — Other Ambulatory Visit (HOSPITAL_COMMUNITY): Payer: Self-pay

## 2023-03-20 DIAGNOSIS — Z7689 Persons encountering health services in other specified circumstances: Secondary | ICD-10-CM

## 2023-03-20 MED ORDER — PHENTERMINE HCL 30 MG PO CAPS
30.0000 mg | ORAL_CAPSULE | ORAL | 1 refills | Status: DC
Start: 2023-03-20 — End: 2023-08-20
  Filled 2023-03-20: qty 8, 8d supply, fill #0
  Filled 2023-03-20: qty 22, 22d supply, fill #0
  Filled 2023-04-21: qty 30, 30d supply, fill #1

## 2023-03-20 NOTE — Telephone Encounter (Signed)
Requested medication (s) are due for refill today - yes  Requested medication (s) are on the active medication list -yes  Future visit scheduled no  Last refill: 02/12/23 #30  Notes to clinic: non delegated Rx  Requested Prescriptions  Pending Prescriptions Disp Refills   phentermine 30 MG capsule 30 capsule 0    Sig: Take 1 capsule (30 mg total) by mouth every morning.     Not Delegated - Neurology: Anticonvulsants - Controlled - phentermine hydrochloride Failed - 03/19/2023  1:47 PM      Failed - This refill cannot be delegated      Passed - eGFR in normal range and within 360 days    GFR calc Af Amer  Date Value Ref Range Status  04/28/2018 >60 >60 mL/min Final    Comment:    (NOTE) The eGFR has been calculated using the CKD EPI equation. This calculation has not been validated in all clinical situations. eGFR's persistently <60 mL/min signify possible Chronic Kidney Disease.    GFR, Estimated  Date Value Ref Range Status  03/02/2021 >60 >60 mL/min Final    Comment:    (NOTE) Calculated using the CKD-EPI Creatinine Equation (2021)    GFR  Date Value Ref Range Status  03/30/2021 120.55 >60.00 mL/min Final    Comment:    Calculated using the CKD-EPI Creatinine Equation (2021)   eGFR  Date Value Ref Range Status  12/20/2022 105 >59 mL/min/1.73 Final         Passed - Cr in normal range and within 360 days    Creatinine, Ser  Date Value Ref Range Status  12/20/2022 1.01 0.76 - 1.27 mg/dL Final         Passed - Last BP in normal range    BP Readings from Last 1 Encounters:  02/12/23 120/76         Passed - Valid encounter within last 6 months    Recent Outpatient Visits           1 month ago Encounter for weight management   [place] [facility] at [facility], Ocie Bob, MD   3 months ago Encounter for weight management   [place] [facility] at [facility], Ocie Bob, MD   4 months  ago Right otitis media, unspecified otitis media type   [facility] Primary Care & Sports Medicine at [facility], Ocie Bob, MD   11 months ago Annual physical exam   [facility] Primary Care & Sports Medicine at [facility], Ocie Bob, MD   1 year ago Allergic rhinitis, unspecified seasonality, unspecified trigger   [place] [facility] Medicine at [facility], Ocie Bob, MD              Passed - Weight completed in the last 3 months    Wt Readings from Last 1 Encounters:  02/12/23 233 lb (105.7 kg)            Requested Prescriptions  Pending Prescriptions Disp Refills   phentermine 30 MG capsule 30 capsule 0    Sig: Take 1 capsule (30 mg total) by mouth every morning.     Not Delegated - Neurology: Anticonvulsants - Controlled - phentermine hydrochloride Failed - 03/19/2023  1:47 PM      Failed - This refill cannot be delegated      Passed - eGFR in normal range and within 360 days  GFR calc Af Amer  Date Value Ref Range Status  04/28/2018 >60 >60 mL/min Final    Comment:    (NOTE) The eGFR has been calculated using the CKD EPI equation. This calculation has not been validated in all clinical situations. eGFR's persistently <60 mL/min signify possible Chronic Kidney Disease.    GFR, Estimated  Date Value Ref Range Status  03/02/2021 >60 >60 mL/min Final    Comment:    (NOTE) Calculated using the CKD-EPI Creatinine Equation (2021)    GFR  Date Value Ref Range Status  03/30/2021 120.55 >60.00 mL/min Final    Comment:    Calculated using the CKD-EPI Creatinine Equation (2021)   eGFR  Date Value Ref Range Status  12/20/2022 105 >59 mL/min/1.73 Final         Passed - Cr in normal range and within 360 days    Creatinine, Ser  Date Value Ref Range Status  12/20/2022 1.01 0.76 - 1.27 mg/dL Final         Passed - Last BP in normal range    BP Readings from Last 1 Encounters:  02/12/23 120/76          Passed - Valid encounter within last 6 months    Recent Outpatient Visits           1 month ago Encounter for weight management   [place] [facility] at [facility], Ocie Bob, MD   3 months ago Encounter for weight management   [place] [facility] at [facility], Ocie Bob, MD   4 months ago Right otitis media, unspecified otitis media type   [facility] Primary Care & Sports Medicine at [facility], Ocie Bob, MD   11 months ago Annual physical exam   [facility] Primary Care & Sports Medicine at [facility], Ocie Bob, MD   1 year ago Allergic rhinitis, unspecified seasonality, unspecified trigger   [place] [facility] Medicine at [facility], Ocie Bob, MD              Passed - Weight completed in the last 3 months    Wt Readings from Last 1 Encounters:  02/12/23 233 lb (105.7 kg)

## 2023-03-20 NOTE — Telephone Encounter (Signed)
Refill

## 2023-04-01 ENCOUNTER — Encounter (INDEPENDENT_AMBULATORY_CARE_PROVIDER_SITE_OTHER): Payer: Self-pay

## 2023-04-06 DIAGNOSIS — G4733 Obstructive sleep apnea (adult) (pediatric): Secondary | ICD-10-CM | POA: Diagnosis not present

## 2023-04-22 ENCOUNTER — Other Ambulatory Visit: Payer: Self-pay

## 2023-05-06 DIAGNOSIS — G4733 Obstructive sleep apnea (adult) (pediatric): Secondary | ICD-10-CM | POA: Diagnosis not present

## 2023-05-27 ENCOUNTER — Other Ambulatory Visit: Payer: Self-pay | Admitting: Family Medicine

## 2023-05-27 DIAGNOSIS — Z7689 Persons encountering health services in other specified circumstances: Secondary | ICD-10-CM

## 2023-05-28 ENCOUNTER — Other Ambulatory Visit (HOSPITAL_COMMUNITY): Payer: Self-pay

## 2023-05-28 NOTE — Telephone Encounter (Signed)
Please advise 

## 2023-05-28 NOTE — Telephone Encounter (Signed)
Requested medication (s) are due for refill today: Yes  Requested medication (s) are on the active medication list: Yes  Last refill:  Topiramate 01/29/23, Phentermine 03/20/23  Future visit scheduled: No  Notes to clinic:  Topiramate Unable to refill per protocol due to failed labs, no updated results. Phentermine Unable to refill per protocol, cannot delegate.      Requested Prescriptions  Pending Prescriptions Disp Refills   topiramate (TOPAMAX) 50 MG tablet 90 tablet 0    Sig: Take 1 tablet (50 mg total) by mouth daily.     Neurology: Anticonvulsants - topiramate & zonisamide Failed - 05/27/2023  1:35 PM      Failed - ALT in normal range and within 360 days    ALT  Date Value Ref Range Status  04/10/2022 38 0 - 44 IU/L Final         Failed - AST in normal range and within 360 days    AST  Date Value Ref Range Status  04/10/2022 28 0 - 40 IU/L Final         Passed - Cr in normal range and within 360 days    Creatinine, Ser  Date Value Ref Range Status  12/20/2022 1.01 0.76 - 1.27 mg/dL Final         Passed - CO2 in normal range and within 360 days    CO2  Date Value Ref Range Status  12/20/2022 25 20 - 29 mmol/L Final         Passed - Completed PHQ-2 or PHQ-9 in the last 360 days      Passed - Valid encounter within last 12 months    Recent Outpatient Visits           3 months ago Encounter for weight management   [place] [facility] at [facility], Ocie Bob, MD   5 months ago Encounter for weight management   [place] [facility] at [facility], Ocie Bob, MD   6 months ago Right otitis media, unspecified otitis media type   [facility] at [facility], Ocie Bob, MD   1 year ago Annual physical exam   [facility] Care & Sports Medicine at [facility], Ocie Bob, MD   1 year ago Allergic rhinitis, unspecified seasonality,  unspecified trigger   [place] [facility] Medicine at [facility], Ocie Bob, MD               phentermine 30 MG capsule 30 capsule 1    Sig: Take 1 capsule (30 mg total) by mouth every morning.     Not Delegated - Neurology: Anticonvulsants - Controlled - phentermine hydrochloride Failed - 05/27/2023  1:35 PM      Failed - This refill cannot be delegated      Failed - Weight completed in the last 3 months    Wt Readings from Last 1 Encounters:  02/12/23 233 lb (105.7 kg)         Passed - eGFR in normal range and within 360 days    GFR calc Af Amer  Date Value Ref Range Status  04/28/2018 >60 >60 mL/min Final    Comment:    (NOTE) The eGFR has been calculated using the CKD EPI equation. This calculation has not been validated in all clinical situations. eGFR's persistently <60 mL/min signify possible Chronic Kidney Disease.  GFR, Estimated  Date Value Ref Range Status  03/02/2021 >60 >60 mL/min Final    Comment:    (NOTE) Calculated using the CKD-EPI Creatinine Equation (2021)    GFR  Date Value Ref Range Status  03/30/2021 120.55 >60.00 mL/min Final    Comment:    Calculated using the CKD-EPI Creatinine Equation (2021)   eGFR  Date Value Ref Range Status  12/20/2022 105 >59 mL/min/1.73 Final         Passed - Cr in normal range and within 360 days    Creatinine, Ser  Date Value Ref Range Status  12/20/2022 1.01 0.76 - 1.27 mg/dL Final         Passed - Last BP in normal range    BP Readings from Last 1 Encounters:  02/12/23 120/76         Passed - Valid encounter within last 6 months    Recent Outpatient Visits           3 months ago Encounter for weight management   [place] [facility] at [facility], Ocie Bob, MD   5 months ago Encounter for weight management   [place] [facility] at [facility], Ocie Bob, MD   6 months ago Right otitis media,  unspecified otitis media type   [facility] Primary Care & Sports Medicine at [facility], Ocie Bob, MD   1 year ago Annual physical exam   [facility] Primary Care & Sports Medicine at [facility], Ocie Bob, MD   1 year ago Allergic rhinitis, unspecified seasonality, unspecified trigger   [place] [facility] Care [facility] Medicine at [facility], Ocie Bob, MD

## 2023-05-31 ENCOUNTER — Ambulatory Visit
Admission: RE | Admit: 2023-05-31 | Discharge: 2023-05-31 | Disposition: A | Discharge status: Home / Self Care | Payer: 59 | Source: Ambulatory Visit

## 2023-05-31 VITALS — BP 120/74 | HR 92 | Temp 99.2°F | Resp 18

## 2023-05-31 DIAGNOSIS — J0100 Acute maxillary sinusitis, unspecified: Secondary | ICD-10-CM | POA: Diagnosis not present

## 2023-05-31 DIAGNOSIS — J209 Acute bronchitis, unspecified: Secondary | ICD-10-CM | POA: Insufficient documentation

## 2023-05-31 DIAGNOSIS — U071 COVID-19: Secondary | ICD-10-CM | POA: Diagnosis not present

## 2023-05-31 LAB — POCT INFLUENZA A/B
Influenza A, POC: NEGATIVE
Influenza B, POC: NEGATIVE

## 2023-05-31 MED ORDER — PROMETHAZINE-DM 6.25-15 MG/5ML PO SYRP: 5.0000 mL | ORAL_SOLUTION | Freq: Four times a day (QID) | ORAL | 0 refills | Status: DC | PRN

## 2023-05-31 MED ORDER — FLUTICASONE PROPIONATE 50 MCG/ACT NA SUSP: 1.0000 | Freq: Two times a day (BID) | NASAL | 0 refills | Status: DC | PRN

## 2023-05-31 NOTE — Discharge Instructions (Addendum)
Your flu test was negative. COVID was sent to the lab for further testing. Someone from our office will call you with your results.Your symptoms appear consistent with a viral respiratory illness.   Recommend symptomatic treatment with nasal spray (Flonase) for congestion and Tylenol/Ibuprofen as needed for fevers/aches. I have also sent a cough syrup to your pharmacy for you to take as directed. Return in 2 to 3 days if no improvement. If new or worsening symptoms such as presistent fevers, difficulty breathing, shortness of breath, or chest pain, go directly to the ER.

## 2023-05-31 NOTE — ED Provider Notes (Addendum)
[facility]  Note:  This document was prepared using Dragon voice recognition software and may include unintentional dictation errors.  MRN: [id number] DOB: 1996/06/18 DATE: 05/31/23   Subjective:  Chief Complaint:  Chief Complaint  Patient presents with   Cough    Have had cough, congestion, and stuffy nose since Tuesday evening. - Entered by patient     HPI: Jason Wheeler is a 27 y.o. male presenting for cough and congestion for the past 3 days. Patient states on Tuesday night he work with body aches and night sweats. He reports feeling feverish at that time, but did not take his temperature. When he woke up the next morning, the body aches had resolved. However, he reports continued with productive cough and congestion. 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 He has been taking Dayquil and Nyquil with relief. Denies nausea, vomiting, sore throat, otalgia. Endorses cough, congestion, myalgias. Presents NAD.  Prior to Admission medications   Medication Sig Start Date End Date Taking? Authorizing Provider  fluticasone (FLONASE) 50 MCG/ACT nasal spray Place 1 spray into both nostrils 2 (two) times daily as needed. 05/31/23  Yes ,  P, PA-C  promethazine-dextromethorphan (PROMETHAZINE-DM) 6.25-15 MG/5ML syrup Take 5 mLs by mouth 4 (four) times daily as needed for cough. 05/31/23  Yes ,  P, PA-C  indapamide (LOZOL) 2.5 MG tablet Take 1 tablet (2.5 mg total) by mouth daily. 01/28/23   Sondra Come, MD  phentermine 30 MG capsule Take 1 capsule (30 mg total) by mouth every morning. 03/20/23   Jerrol Banana, MD  topiramate (TOPAMAX) 50 MG tablet Take 1 tablet (50 mg total) by mouth daily. 01/29/23   Jerrol Banana, MD  buPROPion (WELLBUTRIN SR) 100 MG 12 hr tablet Take 1 tablet (100 mg total) by mouth daily. 09/16/19 10/29/19  Thresa Ross, MD     No Known Allergies  History:   Past Medical History:  Diagnosis Date    ADHD (attention deficit hyperactivity disorder)    Anxiety    Chest wall pain    History of kidney stones    Renal disorder    kidney stones     Past Surgical History:  Procedure Laterality Date   CYSTOSCOPY/URETEROSCOPY/HOLMIUM LASER/STENT PLACEMENT Right 03/14/2021   Procedure: CYSTOSCOPY/URETEROSCOPY/HOLMIUM LASER/ POSSIBLE STENT PLACEMENT;  Surgeon: Riki Altes, MD;  Location: [facility] ORS;  Service: Urology;  Laterality: Right;   TONSILLECTOMY     WISDOM TOOTH EXTRACTION      Family History  Problem Relation Age of Onset   Diabetes Mother    Hypertension Mother    Miscarriages / [place] Mother    Hypertension Father    ADD / ADHD Sister    Throat cancer Maternal Grandfather    Heart disease Paternal Grandmother    Stroke Paternal Grandmother    Prostate cancer Neg Hx    Colon cancer Neg Hx     Social History   Tobacco Use   Smoking status: Never    Passive exposure: Never   Smokeless tobacco: Never  Vaping Use   Vaping status: Never Used  Substance Use Topics   Alcohol use: Not Currently    Comment: occasional   Drug use: Never    Review of Systems  Constitutional:  Positive for diaphoresis.  HENT:  Positive for congestion and rhinorrhea. Negative for ear pain and sore throat.   Respiratory:  Positive for cough.   Gastrointestinal:  Negative for nausea and vomiting.  Musculoskeletal:  Positive for myalgias.  Neurological:  Positive for headaches.     Objective:   Vitals: BP 120/74 (BP Location: Right Arm)   Pulse 92   Temp 99.2 F (37.3 C) (Oral)   Resp 18   SpO2 96%   Physical Exam Constitutional:      General: He is not in acute distress.    Appearance: Normal appearance. He is well-developed. He is obese. He is not ill-appearing or toxic-appearing.  HENT:     Head: Normocephalic and atraumatic.     Right Ear: Tympanic membrane and ear canal normal.     Left Ear: Tympanic membrane and ear canal normal.     Mouth/Throat:     Pharynx:  Oropharynx is clear. Uvula midline. No pharyngeal swelling, oropharyngeal exudate or posterior oropharyngeal erythema.     Tonsils: No tonsillar exudate or tonsillar abscesses.  Cardiovascular:     Rate and Rhythm: Normal rate and regular rhythm.     Heart sounds: Normal heart sounds.  Pulmonary:     Effort: Pulmonary effort is normal.     Breath sounds: Normal breath sounds.     Comments: Clear to auscultation bilaterally  Abdominal:     General: Bowel sounds are normal.     Palpations: Abdomen is soft.     Tenderness: There is no abdominal tenderness.  Lymphadenopathy:     Cervical: No cervical adenopathy.     Right cervical: No superficial cervical adenopathy.    Left cervical: No superficial cervical adenopathy.  Skin:    General: Skin is warm and dry.  Neurological:     General: No focal deficit present.     Mental Status: He is alert.  Psychiatric:        Mood and Affect: Mood and affect normal.     Results:  Labs: Results for orders placed or performed during the hospital encounter of 05/31/23 (from the past 24 hour(s))  POCT Influenza A/B     Status: None   Collection Time: 05/31/23 11:09 AM  Result Value Ref Range   Influenza A, POC Negative Negative   Influenza B, POC Negative Negative    Radiology: No results found.   UC Course/Treatments:  Procedures: Procedures   Medications Ordered in UC: Medications - No data to display   Assessment and Plan :     ICD-10-CM   1. Acute bronchitis, unspecified organism  J20.9 POCT Influenza A/B    SARS CORONAVIRUS 2 (TAT 6-24 HRS) Anterior Nasal Swab    POCT Influenza A/B    SARS CORONAVIRUS 2 (TAT 6-24 HRS) Anterior Nasal Swab    2. Acute non-recurrent maxillary sinusitis  J01.00      Acute bronchitis, unspecified organism Afebrile, nontoxic-appearing, NAD. VSS. DDX includes but not limited to: COVID, flu, bronchitis, pneumonia Flu was negative today in office. COVID is pending. Suspect viral etiology.  Recommend symptomatic treatment. Flonase 1 spray BID for congestion and Promethazine-DM 5mL every 4 hours PRN was prescribed for cough. Recommend OTC analgesics as needed for pain and fevers. If COVID positive and patient desires, can treat with Molnupiravir antiviral. Strict ED precautions were given and patient verbalized understanding.  Acute non-recurrent maxillary sinusitis Afebrile, nontoxic-appearing, NAD. VSS. DDX includes but not limited to: COVID, flu, bronchitis, pneumonia Flu was negative today in office. COVID is pending. Suspect viral etiology. Recommend symptomatic treatment. Flonase 1 spray BID for congestion and Promethazine-DM 5mL every 4 hours PRN was prescribed for cough. Recommend OTC analgesics as needed for pain and  fevers. Strict ED precautions were given and patient verbalized understanding.   ED Discharge Orders          Ordered    fluticasone (FLONASE) 50 MCG/ACT nasal spray  2 times daily PRN        05/31/23 1114    promethazine-dextromethorphan (PROMETHAZINE-DM) 6.25-15 MG/5ML syrup  4 times daily PRN        05/31/23 1114             PDMP not reviewed this encounter.      ,  P, PA-C 05/31/23 1126    ,  P, PA-C 05/31/23 1126

## 2023-05-31 NOTE — ED Triage Notes (Addendum)
Pt c/o cough, congestion, no appetite and stuffy/runny nose since Tuesday night. Noticed body aches the first night. Home remedies: Dayquil, NyQuil and OTC Motrin.

## 2023-06-01 ENCOUNTER — Telehealth: Payer: Self-pay

## 2023-06-01 MED ORDER — MOLNUPIRAVIR 200 MG PO CAPS: 4.0000 | ORAL_CAPSULE | Freq: Two times a day (BID) | ORAL | 0 refills | Status: AC

## 2023-06-03 ENCOUNTER — Other Ambulatory Visit: Payer: Self-pay

## 2023-06-06 DIAGNOSIS — G4733 Obstructive sleep apnea (adult) (pediatric): Secondary | ICD-10-CM | POA: Diagnosis not present

## 2023-07-07 DIAGNOSIS — G4733 Obstructive sleep apnea (adult) (pediatric): Secondary | ICD-10-CM | POA: Diagnosis not present

## 2023-08-06 DIAGNOSIS — G4733 Obstructive sleep apnea (adult) (pediatric): Secondary | ICD-10-CM | POA: Diagnosis not present

## 2023-08-12 ENCOUNTER — Other Ambulatory Visit: Payer: Self-pay | Admitting: Family Medicine

## 2023-08-12 ENCOUNTER — Other Ambulatory Visit: Payer: Self-pay

## 2023-08-12 DIAGNOSIS — Z7689 Persons encountering health services in other specified circumstances: Secondary | ICD-10-CM

## 2023-08-13 ENCOUNTER — Other Ambulatory Visit (HOSPITAL_COMMUNITY): Payer: Self-pay

## 2023-08-13 NOTE — Telephone Encounter (Signed)
Please review.  KP

## 2023-08-13 NOTE — Telephone Encounter (Signed)
Please schedule pt an appointment.  KP

## 2023-08-13 NOTE — Telephone Encounter (Signed)
Requested medications are due for refill today.  yes  Requested medications are on the active medications list.  yes  Last refill. Topamax 01/29/2023 #90 0 rf, Phentermine 03/20/2023 #30 1 rf  Future visit scheduled.   no  Notes to clinic.  Phentermine is not delegated. Labs are expired for Topamax.    Requested Prescriptions  Pending Prescriptions Disp Refills   topiramate (TOPAMAX) 50 MG tablet 90 tablet 0    Sig: Take 1 tablet (50 mg total) by mouth daily.     Neurology: Anticonvulsants - topiramate & zonisamide Failed - 08/12/2023 10:30 AM      Failed - ALT in normal range and within 360 days    ALT  Date Value Ref Range Status  04/10/2022 38 0 - 44 IU/L Final         Failed - AST in normal range and within 360 days    AST  Date Value Ref Range Status  04/10/2022 28 0 - 40 IU/L Final         Passed - Cr in normal range and within 360 days    Creatinine, Ser  Date Value Ref Range Status  12/20/2022 1.01 0.76 - 1.27 mg/dL Final         Passed - CO2 in normal range and within 360 days    CO2  Date Value Ref Range Status  12/20/2022 25 20 - 29 mmol/L Final         Passed - Completed PHQ-2 or PHQ-9 in the last 360 days      Passed - Valid encounter within last 12 months    Recent Outpatient Visits           6 months ago Encounter for weight management   [place] [facility], Ocie Bob, MD   7 months ago Encounter for weight management   [place] [facility], Ocie Bob, MD   9 months ago Right otitis media, unspecified otitis media type   [facility], Ocie Bob, MD   1 year ago Annual physical exam   [facility], Ocie Bob, MD   1 year ago Allergic rhinitis, unspecified seasonality, unspecified trigger   [place] [facility], Ocie Bob, MD               phentermine 30 MG capsule 30 capsule 1    Sig: Take 1 capsule (30 mg total) by mouth every morning.     Not Delegated - Neurology: Anticonvulsants - Controlled - phentermine hydrochloride Failed - 08/12/2023 10:30 AM      Failed - This refill cannot be delegated      Failed - Valid encounter within last 6 months    Recent Outpatient Visits           6 months ago Encounter for weight management   [place] [facility], Ocie Bob, MD   7 months ago Encounter for weight management   [place] [facility], Ocie Bob, MD   9 months ago Right otitis media, unspecified otitis media type   [facility], Ocie Bob, MD   1 year ago Annual physical exam  [facility], Ocie Bob, MD   1 year ago Allergic rhinitis, unspecified seasonality, unspecified trigger   [place] [facility] at [facility], Ocie Bob, MD              Failed - Weight completed in the last 3 months    Wt Readings from Last 1 Encounters:  02/12/23 233 lb (105.7 kg)         Passed - eGFR in normal range and within 360 days    GFR calc Af Amer  Date Value Ref Range Status  04/28/2018 >60 >60 mL/min Final    Comment:    (NOTE) The eGFR has been calculated using the CKD EPI equation. This calculation has not been validated in all clinical situations. eGFR's persistently <60 mL/min signify possible Chronic Kidney Disease.    GFR, Estimated  Date Value Ref Range Status  03/02/2021 >60 >60 mL/min Final    Comment:    (NOTE) Calculated using the CKD-EPI Creatinine Equation (2021)    GFR  Date Value Ref Range Status  03/30/2021 120.55 >60.00 mL/min Final    Comment:    Calculated using the CKD-EPI Creatinine  Equation (2021)   eGFR  Date Value Ref Range Status  12/20/2022 105 >59 mL/min/1.73 Final         Passed - Cr in normal range and within 360 days    Creatinine, Ser  Date Value Ref Range Status  12/20/2022 1.01 0.76 - 1.27 mg/dL Final         Passed - Last BP in normal range    BP Readings from Last 1 Encounters:  05/31/23 120/74

## 2023-08-20 ENCOUNTER — Other Ambulatory Visit (HOSPITAL_COMMUNITY): Payer: Self-pay

## 2023-08-20 ENCOUNTER — Ambulatory Visit (INDEPENDENT_AMBULATORY_CARE_PROVIDER_SITE_OTHER): Payer: 59 | Admitting: Family Medicine

## 2023-08-20 ENCOUNTER — Encounter (INDEPENDENT_AMBULATORY_CARE_PROVIDER_SITE_OTHER): Payer: Self-pay

## 2023-08-20 ENCOUNTER — Encounter: Payer: Self-pay | Admitting: Family Medicine

## 2023-08-20 DIAGNOSIS — Z7689 Persons encountering health services in other specified circumstances: Secondary | ICD-10-CM

## 2023-08-20 MED ORDER — TOPIRAMATE 50 MG PO TABS
50.0000 mg | ORAL_TABLET | Freq: Every day | ORAL | 0 refills | Status: AC
  Filled 2023-08-20: qty 90, 90d supply, fill #0

## 2023-08-20 MED ORDER — PHENTERMINE HCL 37.5 MG PO TABS
37.5000 mg | ORAL_TABLET | Freq: Every morning | ORAL | 2 refills | Status: AC
Start: 2023-08-20 — End: ?
  Filled 2023-08-20: qty 30, 30d supply, fill #0
  Filled 2023-09-29: qty 30, 30d supply, fill #1
  Filled 2023-11-18: qty 30, 30d supply, fill #2

## 2023-08-20 NOTE — Progress Notes (Signed)
Primary Care / Sports Medicine Office Visit  Patient Information:  Patient ID: Jason Wheeler, male DOB: 05-09-1996 Age: 27 y.o. MRN: [id number]   Jason Wheeler is a pleasant 27 y.o. male presenting with the following:  Chief Complaint  Patient presents with   Weight Check    Follow-up weight check    Vitals:   08/20/23 1418  BP: 102/70  Pulse: 70  SpO2: 98%   Vitals:   08/20/23 1418  Weight: 239 lb (108.4 kg)  Height: 5\' 6"  (1.676 m)   Body mass index is 38.58 kg/m.  No results found.   Independent interpretation of notes and tests performed by another provider:   None  Procedures performed:   None  Pertinent History, Exam, Impression, and Recommendations:   Problem List Items Addressed This Visit       Other   Encounter for weight management    Patient returns for follow-up for weight management, tolerating phentermine 30 mg and topiramate 50 mg well.  Overall weight trend has been down.  We discussed the utility of GLP-1 through compounding pharmacy.  Over the interim we have discussed modifications to his current regimen.  Lastly, as patient currently lives in [place], we discussed transition of care to [facility].  Plan: - Start new dose of phentermine 37.5 mg daily - Continue current topiramate 50 mg daily - Can look into "[facility]" in [place] about semaglutide and/or tirzepatide, contact us for Rx if wishing to proceed with compounding - Contact [facility] to establish care and maintain follow-up with their group - Can contact us for any question/concerns between now and when you establish with new group      Relevant Medications   phentermine (ADIPEX-P) 37.5 MG tablet   topiramate (TOPAMAX) 50 MG tablet     Orders & Medications Medications:  Meds ordered this encounter  Medications   phentermine (ADIPEX-P) 37.5 MG tablet    Sig: Take 1 tablet (37.5  mg total) by mouth every morning.    Dispense:  30 tablet    Refill:  2   topiramate (TOPAMAX) 50 MG tablet    Sig: Take 1 tablet (50 mg total) by mouth daily.    Dispense:  90 tablet    Refill:  0   No orders of the defined types were placed in this encounter.    No follow-ups on file.     Jerrol Banana, MD, [facility]   Primary Care Sports Medicine Primary Care and Sports Medicine at [facility]

## 2023-08-20 NOTE — Assessment & Plan Note (Signed)
Patient returns for follow-up for weight management, tolerating phentermine 30 mg and topiramate 50 mg well.  Overall weight trend has been down.  We discussed the utility of GLP-1 through compounding pharmacy.  Over the interim we have discussed modifications to his current regimen.  Lastly, as patient currently lives in [place], we discussed transition of care to [facility].  Plan: - Start new dose of phentermine 37.5 mg daily - Continue current topiramate 50 mg daily - Can look into "[facility]" in [place] about semaglutide and/or tirzepatide, contact us for Rx if wishing to proceed with compounding - Contact [facility] to establish care and maintain follow-up with their group - Can contact us for any question/concerns between now and when you establish with new group

## 2023-08-20 NOTE — Patient Instructions (Signed)
-   Start new dose of phentermine 37.5 mg daily - Continue current topiramate 50 mg daily - Can look into "[facility]" in [place] about semaglutide and/or tirzepatide, contact us for Rx if wishing to proceed with compounding - Contact [facility] to establish care and maintain follow-up with their group - Can contact us for any question/concerns between now and when you establish with new group

## 2023-09-06 DIAGNOSIS — G4733 Obstructive sleep apnea (adult) (pediatric): Secondary | ICD-10-CM | POA: Diagnosis not present

## 2023-09-30 ENCOUNTER — Other Ambulatory Visit: Payer: Self-pay

## 2023-10-09 ENCOUNTER — Other Ambulatory Visit (HOSPITAL_COMMUNITY): Payer: Self-pay

## 2023-11-07 ENCOUNTER — Ambulatory Visit: Payer: 59 | Admitting: Family Medicine

## 2023-11-19 ENCOUNTER — Other Ambulatory Visit (HOSPITAL_COMMUNITY): Payer: Self-pay

## 2023-11-19 ENCOUNTER — Other Ambulatory Visit: Payer: Self-pay

## 2023-12-05 ENCOUNTER — Ambulatory Visit: Payer: 59 | Admitting: Family Medicine

## 2024-01-22 ENCOUNTER — Other Ambulatory Visit: Payer: Self-pay | Admitting: Family Medicine

## 2024-01-22 DIAGNOSIS — Z7689 Persons encountering health services in other specified circumstances: Secondary | ICD-10-CM

## 2024-01-23 ENCOUNTER — Other Ambulatory Visit (HOSPITAL_COMMUNITY): Payer: Self-pay

## 2024-01-23 NOTE — Telephone Encounter (Signed)
 Please review.  KP

## 2024-01-23 NOTE — Telephone Encounter (Signed)
 Requested medications are due for refill today.  yes  Requested medications are on the active medications list.  yes  Last refill. 08/20/2023 90 day supply  Future visit scheduled.   no  Notes to clinic.  It appears that pt is now going to a different clinic.    Requested Prescriptions  Pending Prescriptions Disp Refills   phentermine (ADIPEX-P) 37.5 MG tablet 30 tablet 2    Sig: Take 1 tablet (37.5 mg total) by mouth every morning.     Not Delegated - Neurology: Anticonvulsants - Controlled - phentermine hydrochloride Failed - 01/23/2024  4:00 PM      Failed - This refill cannot be delegated      Failed - eGFR in normal range and within 360 days    GFR calc Af Amer  Date Value Ref Range Status  04/28/2018 >60 >60 mL/min Final    Comment:    (NOTE) The eGFR has been calculated using the CKD EPI equation. This calculation has not been validated in all clinical situations. eGFR's persistently <60 mL/min signify possible Chronic Kidney Disease.    GFR, Estimated  Date Value Ref Range Status  03/02/2021 >60 >60 mL/min Final    Comment:    (NOTE) Calculated using the CKD-EPI Creatinine Equation (2021)    GFR  Date Value Ref Range Status  03/30/2021 120.55 >60.00 mL/min Final    Comment:    Calculated using the CKD-EPI Creatinine Equation (2021)   eGFR  Date Value Ref Range Status  12/20/2022 105 >59 mL/min/1.73 Final         Failed - Cr in normal range and within 360 days    Creatinine, Ser  Date Value Ref Range Status  12/20/2022 1.01 0.76 - 1.27 mg/dL Final         Failed - Valid encounter within last 6 months    Recent Outpatient Visits   None            Failed - Weight completed in the last 3 months    Wt Readings from Last 1 Encounters:  08/20/23 239 lb (108.4 kg)         Passed - Last BP in normal range    BP Readings from Last 1 Encounters:  08/20/23 102/70          topiramate (TOPAMAX) 50 MG tablet 90 tablet 0    Sig: Take 1 tablet (50  mg total) by mouth daily.     Neurology: Anticonvulsants - topiramate & zonisamide Failed - 01/23/2024  4:00 PM      Failed - Cr in normal range and within 360 days    Creatinine, Ser  Date Value Ref Range Status  12/20/2022 1.01 0.76 - 1.27 mg/dL Final         Failed - CO2 in normal range and within 360 days    CO2  Date Value Ref Range Status  12/20/2022 25 20 - 29 mmol/L Final         Failed - ALT in normal range and within 360 days    ALT  Date Value Ref Range Status  04/10/2022 38 0 - 44 IU/L Final         Failed - AST in normal range and within 360 days    AST  Date Value Ref Range Status  04/10/2022 28 0 - 40 IU/L Final         Failed - Valid encounter within last 12 months    Recent Outpatient Visits  None            Passed - Completed PHQ-2 or PHQ-9 in the last 360 days

## 2024-01-28 ENCOUNTER — Other Ambulatory Visit (HOSPITAL_COMMUNITY): Payer: Self-pay

## 2024-02-10 ENCOUNTER — Other Ambulatory Visit (HOSPITAL_COMMUNITY): Payer: Self-pay

## 2024-02-27 ENCOUNTER — Ambulatory Visit: Payer: 59 | Admitting: Family Medicine

## 2024-03-06 ENCOUNTER — Encounter (INDEPENDENT_AMBULATORY_CARE_PROVIDER_SITE_OTHER): Payer: Self-pay

## 2024-03-25 ENCOUNTER — Encounter: Payer: 59 | Admitting: Family Medicine

## 2024-04-14 ENCOUNTER — Other Ambulatory Visit: Payer: Self-pay | Admitting: Urology

## 2024-04-14 DIAGNOSIS — R82994 Hypercalciuria: Secondary | ICD-10-CM

## 2024-04-14 DIAGNOSIS — N200 Calculus of kidney: Secondary | ICD-10-CM

## 2024-04-15 ENCOUNTER — Encounter (HOSPITAL_COMMUNITY): Payer: Self-pay

## 2024-04-15 ENCOUNTER — Other Ambulatory Visit (HOSPITAL_COMMUNITY): Payer: Self-pay

## 2024-04-22 ENCOUNTER — Other Ambulatory Visit (HOSPITAL_COMMUNITY): Payer: Self-pay

## 2024-04-29 ENCOUNTER — Other Ambulatory Visit (HOSPITAL_COMMUNITY): Payer: Self-pay

## 2024-04-29 DIAGNOSIS — N200 Calculus of kidney: Secondary | ICD-10-CM | POA: Diagnosis not present

## 2024-04-29 DIAGNOSIS — N201 Calculus of ureter: Secondary | ICD-10-CM | POA: Diagnosis not present

## 2024-04-29 MED ORDER — INDAPAMIDE 2.5 MG PO TABS
2.5000 mg | ORAL_TABLET | ORAL | 11 refills | Status: AC
  Filled 2024-04-29: qty 30, 30d supply, fill #0

## 2024-08-24 ENCOUNTER — Other Ambulatory Visit (HOSPITAL_COMMUNITY): Payer: Self-pay

## 2024-08-25 ENCOUNTER — Other Ambulatory Visit (HOSPITAL_COMMUNITY): Payer: Self-pay
